# Patient Record
Sex: Male | Born: 1950 | Race: Black or African American | Hispanic: No | Marital: Married | Smoking: Never smoker
Health system: Southern US, Community
[De-identification: ages and names within clinical notes are randomized; demographics above are authoritative.]

## PROBLEM LIST (undated history)

## (undated) DIAGNOSIS — E785 Hyperlipidemia, unspecified: Secondary | ICD-10-CM

## (undated) DIAGNOSIS — N200 Calculus of kidney: Secondary | ICD-10-CM

## (undated) DIAGNOSIS — K635 Polyp of colon: Secondary | ICD-10-CM

## (undated) DIAGNOSIS — J45909 Unspecified asthma, uncomplicated: Secondary | ICD-10-CM

## (undated) HISTORY — PX: HERNIA REPAIR: SHX51

## (undated) HISTORY — DX: Unspecified asthma, uncomplicated: J45.909

## (undated) HISTORY — DX: Calculus of kidney: N20.0

## (undated) HISTORY — DX: Polyp of colon: K63.5

## (undated) HISTORY — PX: EYE SURGERY: SHX253

## (undated) HISTORY — DX: Hyperlipidemia, unspecified: E78.5

## (undated) HISTORY — PX: FOOT SURGERY: SHX648

## (undated) HISTORY — PX: KNEE SURGERY: SHX244

---

## 2009-07-30 ENCOUNTER — Ambulatory Visit: Payer: Self-pay | Admitting: Family Medicine

## 2010-05-09 ENCOUNTER — Ambulatory Visit: Payer: Self-pay | Admitting: Family Medicine

## 2010-11-21 LAB — HM COLONOSCOPY

## 2010-11-27 LAB — HM COLONOSCOPY

## 2015-01-03 ENCOUNTER — Ambulatory Visit: Payer: Self-pay | Admitting: Family Medicine

## 2015-05-31 ENCOUNTER — Other Ambulatory Visit: Payer: Self-pay | Admitting: Family Medicine

## 2015-06-04 ENCOUNTER — Telehealth: Payer: Self-pay | Admitting: Family Medicine

## 2015-06-04 ENCOUNTER — Other Ambulatory Visit: Payer: Self-pay | Admitting: Emergency Medicine

## 2015-06-04 MED ORDER — ATORVASTATIN CALCIUM 20 MG PO TABS: 20.0000 mg | ORAL_TABLET | Freq: Every day | ORAL | Status: DC

## 2015-06-04 NOTE — Telephone Encounter (Signed)
Script sent  

## 2015-06-04 NOTE — Telephone Encounter (Signed)
30 day supply sent to [address]

## 2015-06-04 NOTE — Telephone Encounter (Signed)
Patient scheduled appointment for 06-20-15 but is asking you to send his atorvastatin to [facility]. Also requesting a lab order that way his labs will be here for his appointment.

## 2015-06-05 ENCOUNTER — Other Ambulatory Visit: Payer: Self-pay | Admitting: Family Medicine

## 2015-06-06 NOTE — Telephone Encounter (Signed)
Called to pharmacy 

## 2015-06-11 ENCOUNTER — Telehealth: Payer: Self-pay | Admitting: Family Medicine

## 2015-06-11 DIAGNOSIS — Z0000 Encounter for general adult medical examination without abnormal findings: Secondary | ICD-10-CM

## 2015-06-11 NOTE — Telephone Encounter (Signed)
Patient has appointment for next week and is needing his lab order possibly today

## 2015-06-12 NOTE — Telephone Encounter (Signed)
CBC CMP TSH lipid panel and PSA

## 2015-06-13 NOTE — Telephone Encounter (Signed)
Labs have been ordered and slip placed in file cabinet for pt pick up

## 2015-06-13 NOTE — Telephone Encounter (Signed)
Patient informed. 

## 2015-06-14 DIAGNOSIS — Z0000 Encounter for general adult medical examination without abnormal findings: Secondary | ICD-10-CM | POA: Diagnosis not present

## 2015-06-15 LAB — COMPREHENSIVE METABOLIC PANEL
ALK PHOS: 60 IU/L (ref 39–117)
ALT: 16 IU/L (ref 0–44)
AST: 29 IU/L (ref 0–40)
Albumin/Globulin Ratio: 1.8 (ref 1.1–2.5)
Albumin: 4.3 g/dL (ref 3.6–4.8)
BILIRUBIN TOTAL: 0.3 mg/dL (ref 0.0–1.2)
BUN/Creatinine Ratio: 13 (ref 10–22)
BUN: 17 mg/dL (ref 8–27)
CHLORIDE: 103 mmol/L (ref 97–106)
CO2: 25 mmol/L (ref 18–29)
Calcium: 9.2 mg/dL (ref 8.6–10.2)
Creatinine, Ser: 1.27 mg/dL (ref 0.76–1.27)
GFR calc Af Amer: 68 mL/min/{1.73_m2} (ref >59)
GFR calc non Af Amer: 59 mL/min/{1.73_m2} — ABNORMAL LOW (ref >59)
GLUCOSE: 107 mg/dL — AB (ref 65–99)
Globulin, Total: 2.4 g/dL (ref 1.5–4.5)
Potassium: 4.2 mmol/L (ref 3.5–5.2)
Sodium: 142 mmol/L (ref 136–144)
Total Protein: 6.7 g/dL (ref 6.0–8.5)

## 2015-06-15 LAB — CBC
HEMATOCRIT: 37.7 % (ref 37.5–51.0)
HEMOGLOBIN: 12.1 g/dL — AB (ref 12.6–17.7)
MCH: 27.1 pg (ref 26.6–33.0)
MCHC: 32.1 g/dL (ref 31.5–35.7)
MCV: 84 fL (ref 79–97)
Platelets: 154 10*3/uL (ref 150–379)
RBC: 4.47 x10E6/uL (ref 4.14–5.80)
RDW: 13.4 % (ref 12.3–15.4)
WBC: 3.7 10*3/uL (ref 3.4–10.8)

## 2015-06-15 LAB — LIPID PANEL
CHOLESTEROL TOTAL: 151 mg/dL (ref 100–199)
Chol/HDL Ratio: 2.4 ratio units (ref 0.0–5.0)
HDL: 62 mg/dL (ref >39)
LDL CALC: 80 mg/dL (ref 0–99)
TRIGLYCERIDES: 44 mg/dL (ref 0–149)
VLDL CHOLESTEROL CAL: 9 mg/dL (ref 5–40)

## 2015-06-15 LAB — TSH: TSH: 1.92 u[IU]/mL (ref 0.450–4.500)

## 2015-06-15 LAB — PSA: Prostate Specific Ag, Serum: 1.7 ng/mL (ref 0.0–4.0)

## 2015-06-20 ENCOUNTER — Encounter: Payer: Self-pay | Admitting: Family Medicine

## 2015-06-20 ENCOUNTER — Ambulatory Visit (INDEPENDENT_AMBULATORY_CARE_PROVIDER_SITE_OTHER): Payer: Medicare Other | Admitting: Family Medicine

## 2015-06-20 VITALS — BP 110/76 | HR 70 | Temp 98.1°F | Resp 18 | Ht 72.0 in | Wt 199.6 lb

## 2015-06-20 DIAGNOSIS — R739 Hyperglycemia, unspecified: Secondary | ICD-10-CM | POA: Diagnosis not present

## 2015-06-20 DIAGNOSIS — E785 Hyperlipidemia, unspecified: Secondary | ICD-10-CM | POA: Insufficient documentation

## 2015-06-20 LAB — GLUCOSE, POCT (MANUAL RESULT ENTRY): POC Glucose: 83 mg/dl (ref 70–99)

## 2015-06-20 LAB — POCT GLYCOSYLATED HEMOGLOBIN (HGB A1C): Hemoglobin A1C: 5.7

## 2015-06-20 NOTE — Progress Notes (Signed)
Name: Jason Hayes   MRN: [id number]    DOB: 1951-03-06   Date:06/20/2015       Progress Note  Subjective  Chief Complaint  Chief Complaint  Patient presents with  . Hyperlipidemia    6 month follow up    HPI  Hyperlipidemia  Patient has a history of hyperlipidemia for over 5 years.  Current medical regimen consist of atorvastatin 20 mg daily at bedtime .  Compliance is good .  Diet and exercise are currently followed regularly .  Risk factors for cardiovascular disease include hyperlipidemia and age .   There have been no side effects from the medication.    Hyperglycemia  History of elevated glucose to 107 on 06/09/2017. His last A1c was August 2013. There is no history of any polyuria polydipsia polyphagia.3 Past Medical History  Diagnosis Date  . Hyperlipidemia     Social History  Substance Use Topics  . Smoking status: Never Smoker   . Smokeless tobacco: Never Used  . Alcohol Use: No     Current outpatient prescriptions:  .  atorvastatin (LIPITOR) 20 MG tablet, TAKE ONE TABLET BY MOUTH AT BEDTIME, Disp: 30 tablet, Rfl: 0  No Known Allergies  Review of Systems  Constitutional: Negative for fever, chills and weight loss.  HENT: Negative for congestion, hearing loss, sore throat and tinnitus.   Eyes: Negative for blurred vision, double vision and redness.  Respiratory: Negative for cough, hemoptysis and shortness of breath.   Cardiovascular: Negative for chest pain, palpitations, orthopnea, claudication and leg swelling.  Gastrointestinal: Negative for heartburn, nausea, vomiting, diarrhea, constipation and blood in stool.  Genitourinary: Negative for dysuria, urgency, frequency and hematuria.  Musculoskeletal: Negative for myalgias, back pain, joint pain, falls and neck pain.  Skin: Negative for itching.  Neurological: Negative for dizziness, tingling, tremors, focal weakness, seizures, loss of consciousness, weakness and headaches.  Endo/Heme/Allergies: Does  not bruise/bleed easily.  Psychiatric/Behavioral: Negative for depression and substance abuse. The patient is not nervous/anxious and does not have insomnia.      Objective  Filed Vitals:   06/20/15 0851  BP: 110/76  Pulse: 70  Temp: 98.1 F (36.7 C)  TempSrc: Oral  Resp: 18  Height: 6' (1.829 m)  Weight: 199 lb 9.6 oz (90.538 kg)  SpO2: 97%     Physical Exam  Constitutional: He is oriented to person, place, and time and well-developed, well-nourished, and in no distress.  HENT:  Head: Normocephalic.  Eyes: EOM are normal. Pupils are equal, round, and reactive to light.  Neck: Normal range of motion. Neck supple. No thyromegaly present.  Cardiovascular: Normal rate, regular rhythm and normal heart sounds.   No murmur heard. Pulmonary/Chest: Effort normal and breath sounds normal. No respiratory distress. He has no wheezes.  Abdominal: Soft. Bowel sounds are normal.  Musculoskeletal: Normal range of motion. He exhibits no edema.  Lymphadenopathy:    He has no cervical adenopathy.  Neurological: He is alert and oriented to person, place, and time. No cranial nerve deficit. Gait normal. Coordination normal.  Skin: Skin is warm and dry. No rash noted.  Psychiatric: Affect and judgment normal.      Assessment & Plan   1. Hyperlipidemia Lipid panel and CMP  2. Hyperglycemia  - POCT Glucose (CBG) - POCT HgB A1C

## 2015-07-09 ENCOUNTER — Other Ambulatory Visit: Payer: Self-pay

## 2015-07-09 MED ORDER — ATORVASTATIN CALCIUM 20 MG PO TABS: 20.0000 mg | ORAL_TABLET | Freq: Every day | ORAL | Status: DC

## 2015-08-29 ENCOUNTER — Other Ambulatory Visit: Payer: Self-pay | Admitting: Family Medicine

## 2015-08-29 MED ORDER — ATORVASTATIN CALCIUM 20 MG PO TABS: 20.0000 mg | ORAL_TABLET | Freq: Every day | ORAL | Status: DC

## 2015-09-03 ENCOUNTER — Other Ambulatory Visit: Payer: Self-pay

## 2015-09-03 MED ORDER — ATORVASTATIN CALCIUM 20 MG PO TABS: 20.0000 mg | ORAL_TABLET | Freq: Every day | ORAL | Status: DC

## 2015-12-14 DIAGNOSIS — H40003 Preglaucoma, unspecified, bilateral: Secondary | ICD-10-CM | POA: Diagnosis not present

## 2015-12-18 ENCOUNTER — Ambulatory Visit: Payer: Medicare Other | Admitting: Family Medicine

## 2016-01-18 ENCOUNTER — Telehealth: Payer: Self-pay | Admitting: *Deleted

## 2016-01-21 ENCOUNTER — Ambulatory Visit (INDEPENDENT_AMBULATORY_CARE_PROVIDER_SITE_OTHER): Payer: Medicare Other | Admitting: Family Medicine

## 2016-01-21 ENCOUNTER — Encounter: Payer: Self-pay | Admitting: Family Medicine

## 2016-01-21 VITALS — BP 118/62 | HR 65 | Temp 98.1°F | Ht 71.25 in | Wt 202.2 lb

## 2016-01-21 DIAGNOSIS — Z0000 Encounter for general adult medical examination without abnormal findings: Secondary | ICD-10-CM

## 2016-01-21 MED ORDER — PNEUMOCOCCAL VAC POLYVALENT 25 MCG/0.5ML IJ INJ: 0.5000 mL | INJECTION | INTRAMUSCULAR | Status: DC

## 2016-01-21 NOTE — Patient Instructions (Signed)
You can start daily aspirin 81 mg if you like.  Continue the lipitor.  Follow up in 6 months.  Take care  Dr. Lacinda Axon   Health Maintenance, Male A healthy lifestyle and preventative care can promote health and wellness.  Maintain regular health, dental, and eye exams.  Eat a healthy diet. Foods like vegetables, fruits, whole grains, low-fat dairy products, and lean protein foods contain the nutrients you need and are low in calories. Decrease your intake of foods high in solid fats, added sugars, and salt. Get information about a proper diet from your health care provider, if necessary.  Regular physical exercise is one of the most important things you can do for your health. Most adults should get at least 150 minutes of moderate-intensity exercise (any activity that increases your heart rate and causes you to sweat) each week. In addition, most adults need muscle-strengthening exercises on 2 or more days a week.   Maintain a healthy weight. The body mass index (BMI) is a screening tool to identify possible weight problems. It provides an estimate of body fat based on height and weight. Your health care provider can find your BMI and can help you achieve or maintain a healthy weight. For males 20 years and older:  A BMI below 18.5 is considered underweight.  A BMI of 18.5 to 24.9 is normal.  A BMI of 25 to 29.9 is considered overweight.  A BMI of 30 and above is considered obese.  Maintain normal blood lipids and cholesterol by exercising and minimizing your intake of saturated fat. Eat a balanced diet with plenty of fruits and vegetables. Blood tests for lipids and cholesterol should begin at age 4 and be repeated every 5 years. If your lipid or cholesterol levels are high, you are over age 39, or you are at high risk for heart disease, you may need your cholesterol levels checked more frequently.Ongoing high lipid and cholesterol levels should be treated with medicines if diet and  exercise are not working.  If you smoke, find out from your health care provider how to quit. If you do not use tobacco, do not start.  Lung cancer screening is recommended for adults aged 45-80 years who are at high risk for developing lung cancer because of a history of smoking. A yearly low-dose CT scan of the lungs is recommended for people who have at least a 30-pack-year history of smoking and are current smokers or have quit within the past 15 years. A pack year of smoking is smoking an average of 1 pack of cigarettes a day for 1 year (for example, a 30-pack-year history of smoking could mean smoking 1 pack a day for 30 years or 2 packs a day for 15 years). Yearly screening should continue until the smoker has stopped smoking for at least 15 years. Yearly screening should be stopped for people who develop a health problem that would prevent them from having lung cancer treatment.  If you choose to drink alcohol, do not have more than 2 drinks per day. One drink is considered to be 12 oz (360 mL) of beer, 5 oz (150 mL) of wine, or 1.5 oz (45 mL) of liquor.  Avoid the use of street drugs. Do not share needles with anyone. Ask for help if you need support or instructions about stopping the use of drugs.  High blood pressure causes heart disease and increases the risk of stroke. High blood pressure is more likely to develop in:  People  who have blood pressure in the end of the normal range (100-139/85-89 mm Hg).  People who are overweight or obese.  People who are African American.  If you are 23-27 years of age, have your blood pressure checked every 3-5 years. If you are 82 years of age or older, have your blood pressure checked every year. You should have your blood pressure measured twice--once when you are at a hospital or clinic, and once when you are not at a hospital or clinic. Record the average of the two measurements. To check your blood pressure when you are not at a hospital or  clinic, you can use:  An automated blood pressure machine at a pharmacy.  A home blood pressure monitor.  If you are 70-45 years old, ask your health care provider if you should take aspirin to prevent heart disease.  Diabetes screening involves taking a blood sample to check your fasting blood sugar level. This should be done once every 3 years after age 84 if you are at a normal weight and without risk factors for diabetes. Testing should be considered at a younger age or be carried out more frequently if you are overweight and have at least 1 risk factor for diabetes.  Colorectal cancer can be detected and often prevented. Most routine colorectal cancer screening begins at the age of 71 and continues through age 11. However, your health care provider may recommend screening at an earlier age if you have risk factors for colon cancer. On a yearly basis, your health care provider may provide home test kits to check for hidden blood in the stool. A small camera at the end of a tube may be used to directly examine the colon (sigmoidoscopy or colonoscopy) to detect the earliest forms of colorectal cancer. Talk to your health care provider about this at age 24 when routine screening begins. A direct exam of the colon should be repeated every 5-10 years through age 56, unless early forms of precancerous polyps or small growths are found.  People who are at an increased risk for hepatitis B should be screened for this virus. You are considered at high risk for hepatitis B if:  You were born in a country where hepatitis B occurs often. Talk with your health care provider about which countries are considered high risk.  Your parents were born in a high-risk country and you have not received a shot to protect against hepatitis B (hepatitis B vaccine).  You have HIV or AIDS.  You use needles to inject street drugs.  You live with, or have sex with, someone who has hepatitis B.  You are a man who has  sex with other men (MSM).  You get hemodialysis treatment.  You take certain medicines for conditions like cancer, organ transplantation, and autoimmune conditions.  Hepatitis C blood testing is recommended for all people born from [birth year] through 1966 and any individual with known risk factors for hepatitis C.  Healthy men should no longer receive prostate-specific antigen (PSA) blood tests as part of routine cancer screening. Talk to your health care provider about prostate cancer screening.  Testicular cancer screening is not recommended for adolescents or adult males who have no symptoms. Screening includes self-exam, a health care provider exam, and other screening tests. Consult with your health care provider about any symptoms you have or any concerns you have about testicular cancer.  Practice safe sex. Use condoms and avoid high-risk sexual practices to reduce the spread of sexually  transmitted infections (STIs).  You should be screened for STIs, including gonorrhea and chlamydia if:  You are sexually active and are younger than 24 years.  You are older than 24 years, and your health care provider tells you that you are at risk for this type of infection.  Your sexual activity has changed since you were last screened, and you are at an increased risk for chlamydia or gonorrhea. Ask your health care provider if you are at risk.  If you are at risk of being infected with HIV, it is recommended that you take a prescription medicine daily to prevent HIV infection. This is called pre-exposure prophylaxis (PrEP). You are considered at risk if:  You are a man who has sex with other men (MSM).  You are a heterosexual man who is sexually active with multiple partners.  You take drugs by injection.  You are sexually active with a partner who has HIV.  Talk with your health care provider about whether you are at high risk of being infected with HIV. If you choose to begin PrEP, you should  first be tested for HIV. You should then be tested every 3 months for as long as you are taking PrEP.  Use sunscreen. Apply sunscreen liberally and repeatedly throughout the day. You should seek shade when your shadow is shorter than you. Protect yourself by wearing long sleeves, pants, a wide-brimmed hat, and sunglasses year round whenever you are outdoors.  Tell your health care provider of new moles or changes in moles, especially if there is a change in shape or color. Also, tell your health care provider if a mole is larger than the size of a pencil eraser.  A one-time screening for abdominal aortic aneurysm (AAA) and surgical repair of large AAAs by ultrasound is recommended for men aged 92-75 years who are current or former smokers.  Stay current with your vaccines (immunizations).   This information is not intended to replace advice given to you by your health care provider. Make sure you discuss any questions you have with your health care provider.   Document Released: 01/17/2008 Document Revised: 08/11/2014 Document Reviewed: 12/16/2010 Elsevier Interactive Patient Education [employer].

## 2016-01-21 NOTE — Progress Notes (Signed)
Pre visit review using our clinic review tool, if applicable. No additional management support is needed unless otherwise documented below in the visit note. 

## 2016-01-22 DIAGNOSIS — Z0001 Encounter for general adult medical examination with abnormal findings: Secondary | ICD-10-CM | POA: Insufficient documentation

## 2016-01-22 NOTE — Progress Notes (Signed)
Subjective:  Patient ID: Jason Hayes, male    DOB: [birth year]  Age: 65 y.o. MRN: [id number]  CC: Establish care  HPI Jason Hayes is a 65 y.o. male presents to the clinic today to establish care.  Preventative Healthcare  Colonoscopy: Up to date.    Immunizations  Tetanus - Unsure. Would like me to obtain records from prior PCP.  Pneumococcal - In need of.  Zoster - Up to date.   Prostate cancer screening: Has had PSA in 2016.  Hepatitis C screening - Declines.  Labs: Will wait on labs.  Exercise: Regular exercise. Runs 10K 3X/week.  Alcohol use: None.  Smoking/tobacco use: Nonsmoker  STD/HIV testing: Declines.  Regular dental exams: Yes.  Wears seat belt: Yes.  PMH, Surgical Hx, Family Hx, Social History reviewed and updated as below.  Past Medical History  Diagnosis Date  . Hyperlipidemia   . Asthma   . Kidney stones   . Colon polyps    Past Surgical History  Procedure Laterality Date  . Knee surgery    . Foot surgery    . Hernia repair    . Eye surgery     Family History  Problem Relation Age of Onset  . Hyperlipidemia Mother   . Hypertension Mother   . Diabetes Mother   . Heart disease Mother   . Hypertension Father   . Hyperlipidemia Father   . Diabetes Father   . Heart disease Father   . Hyperlipidemia Sister   . Hypertension Sister   . Diabetes Sister   . Cancer Sister   . Ovarian cancer Sister   . Hypertension Brother   . Hyperlipidemia Brother   . Diabetes Brother   . Prostate cancer Brother   . Prostate cancer Brother   . Stroke Sister    Social History  Substance Use Topics  . Smoking status: Never Smoker   . Smokeless tobacco: Never Used  . Alcohol Use: No   Review of Systems  Gastrointestinal:       Recent nausea, diarrhea (now resolved).  Genitourinary:       Frequent urination.  All other systems reviewed and are negative.  Objective:   Today's Vitals: BP 118/62 mmHg  Pulse 65  Temp(Src) 98.1 F (36.7  C) (Oral)  Ht 5' 11.25" (1.81 m)  Wt 202 lb 4 oz (91.74 kg)  BMI 28.00 kg/m2  SpO2 98%  Physical Exam  Constitutional: He is oriented to person, place, and time. He appears well-developed and well-nourished. No distress.  HENT:  Head: Normocephalic and atraumatic.  Nose: Nose normal.  Mouth/Throat: Oropharynx is clear and moist. No oropharyngeal exudate.  Normal TM's bilaterally.   Eyes: Conjunctivae are normal. No scleral icterus.  Neck: Neck supple. No thyromegaly present.  Cardiovascular: Normal rate and regular rhythm.   No murmur heard. Pulmonary/Chest: Effort normal and breath sounds normal. He has no wheezes. He has no rales.  Abdominal: Soft. He exhibits no distension. There is no tenderness. There is no rebound and no guarding.  Musculoskeletal: Normal range of motion. He exhibits no edema.  Lymphadenopathy:    He has no cervical adenopathy.  Neurological: He is alert and oriented to person, place, and time.  Skin: Skin is warm and dry. No rash noted.  Psychiatric: He has a normal mood and affect.  Vitals reviewed.  Assessment & Plan:   Problem List Items Addressed This Visit    Preventative health care - Primary    Rx for pneumovax given.  Colonoscopy up-to-date. Zostavax up-to-date. Declined hepatitis and HIV screening. Obtain records regarding tetanus.          Outpatient Encounter Prescriptions as of 01/21/2016  Medication Sig  . atorvastatin (LIPITOR) 20 MG tablet Take 1 tablet (20 mg total) by mouth at bedtime.  . pneumococcal 23 valent vaccine (PNEUMOVAX 23) 25 MCG/0.5ML injection Inject 0.5 mLs into the muscle tomorrow at 10 am.  . [DISCONTINUED] atorvastatin (LIPITOR) 20 MG tablet Take 1 tablet (20 mg total) by mouth at bedtime.   No facility-administered encounter medications on file as of 01/21/2016.    Follow-up: 6 months.  [place]

## 2016-01-22 NOTE — Assessment & Plan Note (Signed)
Rx for pneumovax given.  Colonoscopy up-to-date. Zostavax up-to-date. Declined hepatitis and HIV screening. Obtain records regarding tetanus.

## 2016-04-30 ENCOUNTER — Telehealth: Payer: Self-pay | Admitting: *Deleted

## 2016-04-30 MED ORDER — ATORVASTATIN CALCIUM 20 MG PO TABS: 20.0000 mg | ORAL_TABLET | Freq: Every day | ORAL | 0 refills | Status: DC

## 2016-04-30 NOTE — Telephone Encounter (Signed)
Patient has requested a medication refill for atorvastatin  Pharmacy [facility]

## 2016-04-30 NOTE — Telephone Encounter (Signed)
Refilled 09/03/15 but refilled by Dr. Rutherford Nail. Last seen by Dr.Cook on 01/21/16. pts last labs on 06/14/15. Please advise?

## 2016-04-30 NOTE — Telephone Encounter (Signed)
Refills sent to the pharmacy. Patient will need to discuss obtaining labs with Dr. Lacinda Axon.

## 2016-05-01 NOTE — Telephone Encounter (Signed)
Pt will have labs at next OV in december

## 2016-07-22 ENCOUNTER — Other Ambulatory Visit: Payer: Self-pay | Admitting: Family Medicine

## 2016-07-22 NOTE — Telephone Encounter (Signed)
Dr.Cook patient  

## 2016-07-23 ENCOUNTER — Ambulatory Visit (INDEPENDENT_AMBULATORY_CARE_PROVIDER_SITE_OTHER): Payer: Medicare Other | Admitting: Family Medicine

## 2016-07-23 ENCOUNTER — Encounter: Payer: Self-pay | Admitting: Family Medicine

## 2016-07-23 VITALS — BP 116/64 | HR 57 | Temp 97.8°F | Resp 12 | Ht 71.0 in | Wt 209.0 lb

## 2016-07-23 DIAGNOSIS — Z125 Encounter for screening for malignant neoplasm of prostate: Secondary | ICD-10-CM

## 2016-07-23 DIAGNOSIS — R399 Unspecified symptoms and signs involving the genitourinary system: Secondary | ICD-10-CM

## 2016-07-23 DIAGNOSIS — Z23 Encounter for immunization: Secondary | ICD-10-CM

## 2016-07-23 DIAGNOSIS — Z0001 Encounter for general adult medical examination with abnormal findings: Secondary | ICD-10-CM

## 2016-07-23 DIAGNOSIS — R739 Hyperglycemia, unspecified: Secondary | ICD-10-CM | POA: Diagnosis not present

## 2016-07-23 DIAGNOSIS — Z862 Personal history of diseases of the blood and blood-forming organs and certain disorders involving the immune mechanism: Secondary | ICD-10-CM | POA: Diagnosis not present

## 2016-07-23 DIAGNOSIS — E785 Hyperlipidemia, unspecified: Secondary | ICD-10-CM | POA: Diagnosis not present

## 2016-07-23 MED ORDER — ATORVASTATIN CALCIUM 20 MG PO TABS: 20.0000 mg | ORAL_TABLET | Freq: Every day | ORAL | 0 refills | Status: DC

## 2016-07-23 NOTE — Progress Notes (Signed)
Pre visit review using our clinic review tool, if applicable. No additional management support is needed unless otherwise documented below in the visit note. 

## 2016-07-23 NOTE — Patient Instructions (Signed)
Continue the lipitor.  Follow up annually.  Take care  Dr. Lacinda Axon   Health Maintenance, Male A healthy lifestyle and preventative care can promote health and wellness.  Maintain regular health, dental, and eye exams.  Eat a healthy diet. Foods like vegetables, fruits, whole grains, low-fat dairy products, and lean protein foods contain the nutrients you need and are low in calories. Decrease your intake of foods high in solid fats, added sugars, and salt. Get information about a proper diet from your health care provider, if necessary.  Regular physical exercise is one of the most important things you can do for your health. Most adults should get at least 150 minutes of moderate-intensity exercise (any activity that increases your heart rate and causes you to sweat) each week. In addition, most adults need muscle-strengthening exercises on 2 or more days a week.   Maintain a healthy weight. The body mass index (BMI) is a screening tool to identify possible weight problems. It provides an estimate of body fat based on height and weight. Your health care provider can find your BMI and can help you achieve or maintain a healthy weight. For males 20 years and older:  A BMI below 18.5 is considered underweight.  A BMI of 18.5 to 24.9 is normal.  A BMI of 25 to 29.9 is considered overweight.  A BMI of 30 and above is considered obese.  Maintain normal blood lipids and cholesterol by exercising and minimizing your intake of saturated fat. Eat a balanced diet with plenty of fruits and vegetables. Blood tests for lipids and cholesterol should begin at age 48 and be repeated every 5 years. If your lipid or cholesterol levels are high, you are over age 29, or you are at high risk for heart disease, you may need your cholesterol levels checked more frequently.Ongoing high lipid and cholesterol levels should be treated with medicines if diet and exercise are not working.  If you smoke, find out from  your health care provider how to quit. If you do not use tobacco, do not start.  Lung cancer screening is recommended for adults aged 80-80 years who are at high risk for developing lung cancer because of a history of smoking. A yearly low-dose CT scan of the lungs is recommended for people who have at least a 30-pack-year history of smoking and are current smokers or have quit within the past 15 years. A pack year of smoking is smoking an average of 1 pack of cigarettes a day for 1 year (for example, a 30-pack-year history of smoking could mean smoking 1 pack a day for 30 years or 2 packs a day for 15 years). Yearly screening should continue until the smoker has stopped smoking for at least 15 years. Yearly screening should be stopped for people who develop a health problem that would prevent them from having lung cancer treatment.  If you choose to drink alcohol, do not have more than 2 drinks per day. One drink is considered to be 12 oz (360 mL) of beer, 5 oz (150 mL) of wine, or 1.5 oz (45 mL) of liquor.  Avoid the use of street drugs. Do not share needles with anyone. Ask for help if you need support or instructions about stopping the use of drugs.  High blood pressure causes heart disease and increases the risk of stroke. High blood pressure is more likely to develop in:  People who have blood pressure in the end of the normal range (100-139/85-89 mm  Hg).  People who are overweight or obese.  People who are African American.  If you are 50-17 years of age, have your blood pressure checked every 3-5 years. If you are 35 years of age or older, have your blood pressure checked every year. You should have your blood pressure measured twice-once when you are at a hospital or clinic, and once when you are not at a hospital or clinic. Record the average of the two measurements. To check your blood pressure when you are not at a hospital or clinic, you can use:  An automated blood pressure machine at  a pharmacy.  A home blood pressure monitor.  If you are 51-30 years old, ask your health care provider if you should take aspirin to prevent heart disease.  Diabetes screening involves taking a blood sample to check your fasting blood sugar level. This should be done once every 3 years after age 74 if you are at a normal weight and without risk factors for diabetes. Testing should be considered at a younger age or be carried out more frequently if you are overweight and have at least 1 risk factor for diabetes.  Colorectal cancer can be detected and often prevented. Most routine colorectal cancer screening begins at the age of 39 and continues through age 5. However, your health care provider may recommend screening at an earlier age if you have risk factors for colon cancer. On a yearly basis, your health care provider may provide home test kits to check for hidden blood in the stool. A small camera at the end of a tube may be used to directly examine the colon (sigmoidoscopy or colonoscopy) to detect the earliest forms of colorectal cancer. Talk to your health care provider about this at age 12 when routine screening begins. A direct exam of the colon should be repeated every 5-10 years through age 9, unless early forms of precancerous polyps or small growths are found.  People who are at an increased risk for hepatitis B should be screened for this virus. You are considered at high risk for hepatitis B if:  You were born in a country where hepatitis B occurs often. Talk with your health care provider about which countries are considered high risk.  Your parents were born in a high-risk country and you have not received a shot to protect against hepatitis B (hepatitis B vaccine).  You have HIV or AIDS.  You use needles to inject street drugs.  You live with, or have sex with, someone who has hepatitis B.  You are a man who has sex with other men (MSM).  You get hemodialysis  treatment.  You take certain medicines for conditions like cancer, organ transplantation, and autoimmune conditions.  Hepatitis C blood testing is recommended for all people born from [birth year] through 1966 and any individual with known risk factors for hepatitis C.  Healthy men should no longer receive prostate-specific antigen (PSA) blood tests as part of routine cancer screening. Talk to your health care provider about prostate cancer screening.  Testicular cancer screening is not recommended for adolescents or adult males who have no symptoms. Screening includes self-exam, a health care provider exam, and other screening tests. Consult with your health care provider about any symptoms you have or any concerns you have about testicular cancer.  Practice safe sex. Use condoms and avoid high-risk sexual practices to reduce the spread of sexually transmitted infections (STIs).  You should be screened for STIs, including gonorrhea and  chlamydia if:  You are sexually active and are younger than 24 years.  You are older than 24 years, and your health care provider tells you that you are at risk for this type of infection.  Your sexual activity has changed since you were last screened, and you are at an increased risk for chlamydia or gonorrhea. Ask your health care provider if you are at risk.  If you are at risk of being infected with HIV, it is recommended that you take a prescription medicine daily to prevent HIV infection. This is called pre-exposure prophylaxis (PrEP). You are considered at risk if:  You are a man who has sex with other men (MSM).  You are a heterosexual man who is sexually active with multiple partners.  You take drugs by injection.  You are sexually active with a partner who has HIV.  Talk with your health care provider about whether you are at high risk of being infected with HIV. If you choose to begin PrEP, you should first be tested for HIV. You should then be tested  every 3 months for as long as you are taking PrEP.  Use sunscreen. Apply sunscreen liberally and repeatedly throughout the day. You should seek shade when your shadow is shorter than you. Protect yourself by wearing long sleeves, pants, a wide-brimmed hat, and sunglasses year round whenever you are outdoors.  Tell your health care provider of new moles or changes in moles, especially if there is a change in shape or color. Also, tell your health care provider if a mole is larger than the size of a pencil eraser.  A one-time screening for abdominal aortic aneurysm (AAA) and surgical repair of large AAAs by ultrasound is recommended for men aged 26-75 years who are current or former smokers.  Stay current with your vaccines (immunizations). This information is not intended to replace advice given to you by your health care provider. Make sure you discuss any questions you have with your health care provider. Document Released: 01/17/2008 Document Revised: 08/11/2014 Document Reviewed: 04/24/2015 Elsevier Interactive Patient Education  2017 [employer].

## 2016-07-24 LAB — LIPID PANEL
Cholesterol: 155 mg/dL (ref 0–200)
HDL: 53.4 mg/dL (ref >39.00)
LDL CALC: 91 mg/dL (ref 0–99)
NONHDL: 101.28
Total CHOL/HDL Ratio: 3
Triglycerides: 49 mg/dL (ref 0.0–149.0)
VLDL: 9.8 mg/dL (ref 0.0–40.0)

## 2016-07-24 LAB — COMPREHENSIVE METABOLIC PANEL
ALK PHOS: 61 U/L (ref 39–117)
ALT: 17 U/L (ref 0–53)
AST: 23 U/L (ref 0–37)
Albumin: 4.4 g/dL (ref 3.5–5.2)
BUN: 18 mg/dL (ref 6–23)
CHLORIDE: 106 meq/L (ref 96–112)
CO2: 31 mEq/L (ref 19–32)
Calcium: 9.8 mg/dL (ref 8.4–10.5)
Creatinine, Ser: 1.4 mg/dL (ref 0.40–1.50)
GFR: 65.17 mL/min (ref >60.00)
GLUCOSE: 92 mg/dL (ref 70–99)
POTASSIUM: 4.2 meq/L (ref 3.5–5.1)
SODIUM: 142 meq/L (ref 135–145)
TOTAL PROTEIN: 7.3 g/dL (ref 6.0–8.3)
Total Bilirubin: 0.5 mg/dL (ref 0.2–1.2)

## 2016-07-24 LAB — CBC
HCT: 40.5 % (ref 39.0–52.0)
Hemoglobin: 13.1 g/dL (ref 13.0–17.0)
MCHC: 32.3 g/dL (ref 30.0–36.0)
MCV: 84.1 fl (ref 78.0–100.0)
Platelets: 158 K/uL (ref 150.0–400.0)
RBC: 4.81 Mil/uL (ref 4.22–5.81)
RDW: 13.4 % (ref 11.5–15.5)
WBC: 5.2 K/uL (ref 4.0–10.5)

## 2016-07-24 LAB — PSA: PSA: 2.37 ng/mL (ref 0.10–4.00)

## 2016-07-24 LAB — HEMOGLOBIN A1C: Hgb A1c MFr Bld: 5.8 % (ref 4.6–6.5)

## 2016-07-24 NOTE — Assessment & Plan Note (Signed)
Prevnar today. Labs today. See orders. Declines flu. PSA screening today. Placing referral for colonoscopy. Patient having LUTS. Patient states that this does not impact him enough to want to consider medication. This is likely from BPH. We'll continue to monitor closely.

## 2016-07-24 NOTE — Progress Notes (Signed)
Subjective:  Patient ID: Jason Hayes, male    DOB: 06/30/1951  Age: 66 y.o. MRN: [id number]  CC: Annual exam  HPI Jason Hayes is a 65 y.o. male presents to the clinic today for an annual exam.   Preventative Healthcare  Colonoscopy: Reports that he is due for (has been 5 years). Wants referral.  Immunizations  Tetanus - Unsure. No records from prior PCP.  Pneumococcal - In need of. Okay with receiving today.  Flu - Declines.   Zoster - Addressed.  Prostate cancer screening: With labs today.  Hepatitis C screening - Declined.  Labs: Labs today.  Exercise: Exercises regularly (runs).  Alcohol use: No.  Smoking/tobacco use: No.  PMH, Surgical Hx, Family Hx, Social History reviewed and updated as below.  Past Medical History:  Diagnosis Date  . Asthma   . Colon polyps   . Hyperlipidemia   . Kidney stones    Past Surgical History:  Procedure Laterality Date  . EYE SURGERY    . FOOT SURGERY    . HERNIA REPAIR    . KNEE SURGERY     Family History  Problem Relation Age of Onset  . Hyperlipidemia Mother   . Hypertension Mother   . Diabetes Mother   . Heart disease Mother   . Hypertension Father   . Hyperlipidemia Father   . Diabetes Father   . Heart disease Father   . Hyperlipidemia Sister   . Hypertension Sister   . Diabetes Sister   . Cancer Sister   . Ovarian cancer Sister   . Hypertension Brother   . Hyperlipidemia Brother   . Diabetes Brother   . Prostate cancer Brother   . Prostate cancer Brother   . Stroke Sister    Social History  Substance Use Topics  . Smoking status: Never Smoker  . Smokeless tobacco: Never Used  . Alcohol use No   Review of Systems  Genitourinary: Positive for frequency.  All other systems reviewed and are negative.  Objective:   Today's Vitals: BP 116/64   Pulse (!) 57   Temp 97.8 F (36.6 C) (Oral)   Resp 12   Ht 5\' 11"  ([id number] m) Comment: with shoes  Wt 209 lb (94.8 kg)   SpO2 98%   BMI 29.15  kg/m   Physical Exam  Constitutional: He is oriented to person, place, and time. He appears well-developed. No distress.  HENT:  Head: Normocephalic and atraumatic.  Eyes: Conjunctivae are normal.  Neck: Normal range of motion. Neck supple.  Cardiovascular: Normal rate and regular rhythm.   Pulmonary/Chest: Effort normal and breath sounds normal.  Abdominal: Soft. He exhibits no distension. There is no tenderness. There is no rebound and no guarding.  Musculoskeletal: Normal range of motion.  Neurological: He is alert and oriented to person, place, and time.  Skin: No rash noted.  Psychiatric: He has a normal mood and affect.  Vitals reviewed.  Assessment & Plan:   Problem List Items Addressed This Visit    Hyperlipidemia   Relevant Medications   atorvastatin (LIPITOR) 20 MG tablet   Other Relevant Orders   Lipid panel   Encounter for health maintenance examination with abnormal findings - Primary    Prevnar today. Labs today. See orders. Declines flu. PSA screening today. Placing referral for colonoscopy. Patient having LUTS. Patient states that this does not impact him enough to want to consider medication. This is likely from BPH. We'll continue to monitor closely.  Other Visit Diagnoses    Lower urinary tract symptoms (LUTS)       Relevant Orders   PSA   History of anemia       Relevant Orders   CBC   Blood glucose elevated       Relevant Orders   Hemoglobin A1c   Comprehensive metabolic panel   Screening for prostate cancer       Relevant Orders   PSA   Need for prophylactic vaccination against Streptococcus pneumoniae (pneumococcus)       Relevant Orders   Pneumococcal conjugate vaccine 13-valent (Completed)      Outpatient Encounter Prescriptions as of 07/23/2016  Medication Sig  . atorvastatin (LIPITOR) 20 MG tablet Take 1 tablet (20 mg total) by mouth at bedtime.  . [DISCONTINUED] atorvastatin (LIPITOR) 20 MG tablet Take 1 tablet (20 mg total)  by mouth at bedtime.  . [DISCONTINUED] pneumococcal 23 valent vaccine (PNEUMOVAX 23) 25 MCG/0.5ML injection Inject 0.5 mLs into the muscle tomorrow at 10 am. (Patient not taking: Reported on 07/23/2016)   No facility-administered encounter medications on file as of 07/23/2016.     Follow-up: Annually  [place]

## 2016-10-25 ENCOUNTER — Other Ambulatory Visit: Payer: Self-pay | Admitting: Family Medicine

## 2016-12-18 ENCOUNTER — Encounter: Payer: Self-pay | Admitting: Family Medicine

## 2016-12-18 ENCOUNTER — Ambulatory Visit (INDEPENDENT_AMBULATORY_CARE_PROVIDER_SITE_OTHER): Payer: Medicare Other | Admitting: Family Medicine

## 2016-12-18 ENCOUNTER — Ambulatory Visit (INDEPENDENT_AMBULATORY_CARE_PROVIDER_SITE_OTHER): Payer: Medicare Other

## 2016-12-18 DIAGNOSIS — Z0100 Encounter for examination of eyes and vision without abnormal findings: Secondary | ICD-10-CM | POA: Diagnosis not present

## 2016-12-18 DIAGNOSIS — R7303 Prediabetes: Secondary | ICD-10-CM | POA: Insufficient documentation

## 2016-12-18 NOTE — Progress Notes (Signed)
Pt had paperwork to have his vision checked. PCP gave verbal okay to check pts vision.

## 2016-12-19 NOTE — Progress Notes (Signed)
Changed to RN visit.   [place]

## 2016-12-26 NOTE — Progress Notes (Signed)
Care was provided under my supervision. 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  Thersa Salt DO

## 2017-01-27 ENCOUNTER — Other Ambulatory Visit: Payer: Self-pay | Admitting: Family Medicine

## 2017-02-19 ENCOUNTER — Telehealth: Payer: Self-pay | Admitting: Family Medicine

## 2017-02-19 NOTE — Telephone Encounter (Signed)
Spoke to pt's wife. Left message for Mr. Sainato to call office [phone]) to schedule awv. SF

## 2017-03-12 ENCOUNTER — Telehealth: Payer: Self-pay | Admitting: Family Medicine

## 2017-03-12 NOTE — Telephone Encounter (Signed)
Left pt message asking to call Ebony Hail back directly at [phone] to schedule AWV. Thanks!  *NOTE* Last AWV 01/21/16

## 2017-04-14 ENCOUNTER — Telehealth: Payer: Self-pay | Admitting: *Deleted

## 2017-04-14 NOTE — Telephone Encounter (Signed)
FYI : pt requested to have his PCP changed to Dr. Caryl Bis  Former [place] [facility]

## 2017-04-15 NOTE — Telephone Encounter (Signed)
Spoke to house hold member. Pt was not currently home and she will have him call back to schedule AWV.  Note* Last AWV 01/21/16

## 2017-04-16 ENCOUNTER — Other Ambulatory Visit: Payer: Self-pay | Admitting: Family Medicine

## 2017-07-14 ENCOUNTER — Other Ambulatory Visit: Payer: Self-pay | Admitting: Family Medicine

## 2017-08-05 ENCOUNTER — Ambulatory Visit (INDEPENDENT_AMBULATORY_CARE_PROVIDER_SITE_OTHER): Payer: Medicare Other | Admitting: Family Medicine

## 2017-08-05 ENCOUNTER — Encounter: Payer: Self-pay | Admitting: Family Medicine

## 2017-08-05 VITALS — BP 112/68 | HR 64 | Temp 97.7°F | Ht 71.0 in | Wt 198.0 lb

## 2017-08-05 DIAGNOSIS — Z125 Encounter for screening for malignant neoplasm of prostate: Secondary | ICD-10-CM

## 2017-08-05 DIAGNOSIS — B9789 Other viral agents as the cause of diseases classified elsewhere: Secondary | ICD-10-CM

## 2017-08-05 DIAGNOSIS — J988 Other specified respiratory disorders: Secondary | ICD-10-CM

## 2017-08-05 DIAGNOSIS — E785 Hyperlipidemia, unspecified: Secondary | ICD-10-CM

## 2017-08-05 DIAGNOSIS — R351 Nocturia: Secondary | ICD-10-CM | POA: Diagnosis not present

## 2017-08-05 DIAGNOSIS — R7303 Prediabetes: Secondary | ICD-10-CM | POA: Diagnosis not present

## 2017-08-05 DIAGNOSIS — N400 Enlarged prostate without lower urinary tract symptoms: Secondary | ICD-10-CM | POA: Insufficient documentation

## 2017-08-05 DIAGNOSIS — N401 Enlarged prostate with lower urinary tract symptoms: Secondary | ICD-10-CM

## 2017-08-05 DIAGNOSIS — Z1159 Encounter for screening for other viral diseases: Secondary | ICD-10-CM | POA: Diagnosis not present

## 2017-08-05 DIAGNOSIS — R7309 Other abnormal glucose: Secondary | ICD-10-CM | POA: Diagnosis not present

## 2017-08-05 LAB — COMPREHENSIVE METABOLIC PANEL
ALT: 25 U/L (ref 0–53)
AST: 32 U/L (ref 0–37)
Albumin: 4.6 g/dL (ref 3.5–5.2)
Alkaline Phosphatase: 70 U/L (ref 39–117)
BUN: 19 mg/dL (ref 6–23)
CHLORIDE: 103 meq/L (ref 96–112)
CO2: 31 mEq/L (ref 19–32)
Calcium: 9.8 mg/dL (ref 8.4–10.5)
Creatinine, Ser: 1.29 mg/dL (ref 0.40–1.50)
GFR: 71.4 mL/min (ref >60.00)
GLUCOSE: 92 mg/dL (ref 70–99)
POTASSIUM: 4.1 meq/L (ref 3.5–5.1)
SODIUM: 141 meq/L (ref 135–145)
Total Bilirubin: 0.7 mg/dL (ref 0.2–1.2)
Total Protein: 7.6 g/dL (ref 6.0–8.3)

## 2017-08-05 LAB — HEMOGLOBIN A1C: Hgb A1c MFr Bld: 6 % (ref 4.6–6.5)

## 2017-08-05 LAB — LIPID PANEL
Cholesterol: 168 mg/dL (ref 0–200)
HDL: 52.5 mg/dL (ref >39.00)
LDL CALC: 103 mg/dL — AB (ref 0–99)
NONHDL: 115.47
Total CHOL/HDL Ratio: 3
Triglycerides: 64 mg/dL (ref 0.0–149.0)
VLDL: 12.8 mg/dL (ref 0.0–40.0)

## 2017-08-05 LAB — PSA, MEDICARE: PSA: 3.06 ng/mL (ref 0.10–4.00)

## 2017-08-05 MED ORDER — PNEUMOCOCCAL VAC POLYVALENT 25 MCG/0.5ML IJ INJ: 0.5000 mL | INJECTION | Freq: Once | INTRAMUSCULAR | 0 refills | Status: AC

## 2017-08-05 NOTE — Assessment & Plan Note (Signed)
Suspect viral respiratory illness with possible bronchitis given patient's cough and respiratory symptoms.  He notes no cardiac symptoms.  Discussed this should improve on its own.  He will monitor and if not improving he will let us know.

## 2017-08-05 NOTE — Patient Instructions (Signed)
Nice to see you. We will contact you with your lab results. Please monitor your cough.  If this does not improve over the next week please let us know. Please continue to work on diet and exercise. If you change your mind regarding Flomax please let us know.

## 2017-08-05 NOTE — Assessment & Plan Note (Addendum)
History and exam is consistent with BPH.  Due for PSA to be checked.  Discussed trial of Flomax though he deferred this.  If he changes his mind he will let us know.

## 2017-08-05 NOTE — Progress Notes (Signed)
Tommi Rumps, MD Phone: [phone]  Jason Hayes is a 67 y.o. male who presents today for f/u and est care.  Patient notes cough since late last week with no significant congestion.  Notes it is worse at night.  Notes a Chakraborty tightness when he lays down at night though no chest pain or shortness of breath.  He notes he has been exercising with no difficulty.  Notes his wife has been sick.  He reports urinary frequency that is not as frequent if he exercises.  Notes nocturia one time per night when he does not exercise.  No straining.  Notes his stream is not as strong as previously.  Does note a history of elevated PSAs in the past with multiple biopsies that did not reveal cancer.  Most recent PSA has been normal.  Hyperlipidemia: Taking Lipitor.  No chest pain or shortness of breath.  He does try to exercise 3 days a week.  Does not really watch what he eats.  No prior hepatitis C testing.  He thinks he has had a tetanus vaccination in the last 10 years.  He declines flu shot.  Needs Pneumovax.  Up-to-date on colonoscopy though we will need to request the report.  No tobacco use, alcohol use, or illicit drug use.  Active Ambulatory Problems    Diagnosis Date Noted  . Hyperlipidemia 06/20/2015  . Prediabetes 12/18/2016  . BPH (benign prostatic hyperplasia) 08/05/2017  . Viral respiratory illness 08/05/2017   Resolved Ambulatory Problems    Diagnosis Date Noted  . Encounter for health maintenance examination with abnormal findings 01/22/2016   Past Medical History:  Diagnosis Date  . Asthma   . Colon polyps   . Hyperlipidemia   . Kidney stones     Family History  Problem Relation Age of Onset  . Hyperlipidemia Mother   . Hypertension Mother   . Diabetes Mother   . Heart disease Mother   . Hypertension Father   . Hyperlipidemia Father   . Diabetes Father   . Heart disease Father   . Hyperlipidemia Sister   . Hypertension Sister   . Diabetes Sister   . Cancer  Sister   . Ovarian cancer Sister   . Hypertension Brother   . Hyperlipidemia Brother   . Diabetes Brother   . Prostate cancer Brother   . Prostate cancer Brother   . Stroke Sister     Social History   Socioeconomic History  . Marital status: Married    Spouse name: Not on file  . Number of children: Not on file  . Years of education: Not on file  . Highest education level: Not on file  Social Needs  . Financial resource strain: Not on file  . Food insecurity - worry: Not on file  . Food insecurity - inability: Not on file  . Transportation needs - medical: Not on file  . Transportation needs - non-medical: Not on file  Occupational History  . Not on file  Tobacco Use  . Smoking status: Never Smoker  . Smokeless tobacco: Never Used  Substance and Sexual Activity  . Alcohol use: No    Alcohol/week: 0.0 oz  . Drug use: No  . Sexual activity: Yes    Partners: Female  Other Topics Concern  . Not on file  Social History Narrative  . Not on file    ROS  General:  Negative for nexplained weight loss, fever Skin: Negative for new or changing mole, sore that won't heal  HEENT: Negative for trouble hearing, trouble seeing, ringing in ears, mouth sores, hoarseness, change in voice, dysphagia. CV:  Negative for chest pain, dyspnea, edema, palpitations Resp: Positive for cough, negative for dyspnea, hemoptysis GI: Negative for nausea, vomiting, diarrhea, constipation, abdominal pain, melena, hematochezia. GU: Positive for frequency of urination, negative for dysuria, incontinence, urinary hesitance, hematuria, vaginal or penile discharge, sexual difficulty, lumps in testicle or breasts MSK: Negative for muscle cramps or aches, joint pain or swelling Neuro: Negative for headaches, weakness, numbness, dizziness, passing out/fainting Psych: Negative for depression, anxiety, memory problems  Objective  Physical Exam Vitals:   08/05/17 1406  BP: 112/68  Pulse: 64  Temp: 97.7  F (36.5 C)  SpO2: 99%    BP Readings from Last 3 Encounters:  08/05/17 112/68  07/23/16 116/64  01/21/16 118/62   Wt Readings from Last 3 Encounters:  08/05/17 198 lb (89.8 kg)  07/23/16 209 lb (94.8 kg)  01/21/16 202 lb 4 oz (91.7 kg)    Physical Exam  Constitutional: No distress.  HENT:  Mouth/Throat: Oropharynx is clear and moist. No oropharyngeal exudate.  Eyes: Conjunctivae are normal. Pupils are equal, round, and reactive to light.  Cardiovascular: Normal rate, regular rhythm and normal heart sounds.  Pulmonary/Chest: Effort normal and breath sounds normal.  Abdominal: Soft. Bowel sounds are normal. He exhibits no distension. There is no tenderness.  Genitourinary:  Genitourinary Comments: Normal rectum, mildly enlarged prostate with no nodules noted  Musculoskeletal: He exhibits no edema.  Neurological: He is alert. Gait normal.  Skin: Skin is warm and dry. He is not diaphoretic.  Psychiatric: Mood and affect normal.     Assessment/Plan:   Hyperlipidemia Check lipid panel.  Continue current medication.  BPH (benign prostatic hyperplasia) History and exam is consistent with BPH.  Due for PSA to be checked.  Discussed trial of Flomax though he deferred this.  If he changes his mind he will let us know.  Prediabetes Patient will continue to work on diet and exercise.  Viral respiratory illness Suspect viral respiratory illness with possible bronchitis given patient's cough and respiratory symptoms.  He notes no cardiac symptoms.  Discussed this should improve on its own.  He will monitor and if not improving he will let us know.   Orders Placed This Encounter  Procedures  . Comp Met (CMET)  . Lipid panel  . PSA, Medicare  . Hepatitis C Antibody  . HgB A1c    Meds ordered this encounter  Medications  . pneumococcal 23 valent vaccine (PNU-IMMUNE) 25 MCG/0.5ML injection    Sig: Inject 0.5 mLs into the muscle once for 1 dose.    Dispense:  0.5 mL     Refill:  0     Tommi Rumps, MD [place]

## 2017-08-05 NOTE — Assessment & Plan Note (Signed)
Check lipid panel.  Continue current medication. 

## 2017-08-05 NOTE — Progress Notes (Signed)
Pre visit review using our clinic review tool, if applicable. No additional management support is needed unless otherwise documented below in the visit note. 

## 2017-08-05 NOTE — Assessment & Plan Note (Signed)
Patient will continue to work on diet and exercise. °

## 2017-08-06 LAB — HEPATITIS C ANTIBODY
HEP C AB: NONREACTIVE
SIGNAL TO CUT-OFF: 0.01 (ref <1.00)

## 2017-08-14 ENCOUNTER — Telehealth: Payer: Self-pay | Admitting: Family Medicine

## 2017-08-14 MED ORDER — ATORVASTATIN CALCIUM 40 MG PO TABS: 40.0000 mg | ORAL_TABLET | Freq: Every day | ORAL | 3 refills | Status: DC

## 2017-08-14 NOTE — Telephone Encounter (Signed)
FYI

## 2017-08-14 NOTE — Telephone Encounter (Signed)
Forgot to put in result note: Pt agreeable to increasing Lipitor to 40 mg.

## 2017-08-14 NOTE — Addendum Note (Signed)
Addended by: Caryl Bis, [place] G on: 08/14/2017 01:01 PM   Modules accepted: Orders

## 2017-08-14 NOTE — Telephone Encounter (Signed)
Left message letting him know that medication was at the pharmacy

## 2017-08-14 NOTE — Telephone Encounter (Signed)
Sent to pharmacy 

## 2017-10-11 ENCOUNTER — Other Ambulatory Visit: Payer: Self-pay | Admitting: Family Medicine

## 2018-03-05 DIAGNOSIS — H40003 Preglaucoma, unspecified, bilateral: Secondary | ICD-10-CM | POA: Diagnosis not present

## 2018-04-12 ENCOUNTER — Other Ambulatory Visit: Payer: Self-pay

## 2018-04-12 MED ORDER — ATORVASTATIN CALCIUM 20 MG PO TABS: 20.0000 mg | ORAL_TABLET | Freq: Every day | ORAL | 1 refills | Status: DC

## 2018-08-06 ENCOUNTER — Encounter: Payer: BLUE CROSS/BLUE SHIELD | Admitting: Family Medicine

## 2018-08-06 ENCOUNTER — Telehealth: Payer: Self-pay | Admitting: Family Medicine

## 2018-08-06 ENCOUNTER — Encounter: Payer: Self-pay | Admitting: Family Medicine

## 2018-08-06 ENCOUNTER — Ambulatory Visit (INDEPENDENT_AMBULATORY_CARE_PROVIDER_SITE_OTHER): Payer: Medicare Other | Admitting: Family Medicine

## 2018-08-06 VITALS — BP 100/80 | HR 54 | Temp 97.4°F | Ht 70.5 in | Wt 201.2 lb

## 2018-08-06 DIAGNOSIS — E785 Hyperlipidemia, unspecified: Secondary | ICD-10-CM | POA: Diagnosis not present

## 2018-08-06 DIAGNOSIS — N401 Enlarged prostate with lower urinary tract symptoms: Secondary | ICD-10-CM

## 2018-08-06 DIAGNOSIS — R7303 Prediabetes: Secondary | ICD-10-CM

## 2018-08-06 DIAGNOSIS — R351 Nocturia: Secondary | ICD-10-CM | POA: Diagnosis not present

## 2018-08-06 DIAGNOSIS — Z1211 Encounter for screening for malignant neoplasm of colon: Secondary | ICD-10-CM

## 2018-08-06 DIAGNOSIS — E663 Overweight: Secondary | ICD-10-CM | POA: Diagnosis not present

## 2018-08-06 DIAGNOSIS — Z125 Encounter for screening for malignant neoplasm of prostate: Secondary | ICD-10-CM

## 2018-08-06 DIAGNOSIS — Z8042 Family history of malignant neoplasm of prostate: Secondary | ICD-10-CM | POA: Diagnosis not present

## 2018-08-06 DIAGNOSIS — Z130 Encounter for screening for diseases of the blood and blood-forming organs and certain disorders involving the immune mechanism: Secondary | ICD-10-CM

## 2018-08-06 DIAGNOSIS — Z1329 Encounter for screening for other suspected endocrine disorder: Secondary | ICD-10-CM

## 2018-08-06 LAB — COMPREHENSIVE METABOLIC PANEL
ALBUMIN: 4.5 g/dL (ref 3.5–5.2)
ALT: 22 U/L (ref 0–53)
AST: 30 U/L (ref 0–37)
Alkaline Phosphatase: 62 U/L (ref 39–117)
BUN: 17 mg/dL (ref 6–23)
CHLORIDE: 105 meq/L (ref 96–112)
CO2: 29 meq/L (ref 19–32)
CREATININE: 1.29 mg/dL (ref 0.40–1.50)
Calcium: 9.7 mg/dL (ref 8.4–10.5)
GFR: 71.18 mL/min (ref >60.00)
Glucose, Bld: 85 mg/dL (ref 70–99)
Potassium: 4 mEq/L (ref 3.5–5.1)
SODIUM: 141 meq/L (ref 135–145)
Total Bilirubin: 0.8 mg/dL (ref 0.2–1.2)
Total Protein: 7.2 g/dL (ref 6.0–8.3)

## 2018-08-06 LAB — TSH: TSH: 1.01 u[IU]/mL (ref 0.35–4.50)

## 2018-08-06 LAB — LIPID PANEL
CHOL/HDL RATIO: 3
CHOLESTEROL: 162 mg/dL (ref 0–200)
HDL: 58.6 mg/dL (ref >39.00)
LDL CALC: 95 mg/dL (ref 0–99)
NONHDL: 103.49
Triglycerides: 41 mg/dL (ref 0.0–149.0)
VLDL: 8.2 mg/dL (ref 0.0–40.0)

## 2018-08-06 LAB — POCT URINALYSIS DIPSTICK
BILIRUBIN UA: NEGATIVE
Blood, UA: NEGATIVE
GLUCOSE UA: NEGATIVE
Leukocytes, UA: NEGATIVE
Nitrite, UA: NEGATIVE
PH UA: 6 (ref 5.0–8.0)
Protein, UA: NEGATIVE
Spec Grav, UA: 1.02 (ref 1.010–1.025)
UROBILINOGEN UA: 0.2 U/dL

## 2018-08-06 LAB — HEMOGLOBIN A1C: Hgb A1c MFr Bld: 5.7 % (ref 4.6–6.5)

## 2018-08-06 LAB — PSA, MEDICARE: PSA: 29.96 ng/ml — ABNORMAL HIGH (ref 0.10–4.00)

## 2018-08-06 MED ORDER — TETANUS-DIPHTH-ACELL PERTUSSIS 5-2.5-18.5 LF-MCG/0.5 IM SUSP: 0.5000 mL | Freq: Once | INTRAMUSCULAR | 0 refills | Status: AC

## 2018-08-06 MED ORDER — PNEUMOCOCCAL VAC POLYVALENT 25 MCG/0.5ML IJ INJ: 0.5000 mL | INJECTION | Freq: Once | INTRAMUSCULAR | 0 refills | Status: AC

## 2018-08-06 NOTE — Progress Notes (Addendum)
Jason Rumps, MD Phone: [phone]  Jason Hayes is a 68 y.o. male who presents today for follow-up.Marland Kitchen  BPH: Patient notes sporadic nocturia where he will go 2-3 times at night though this will only occur 2-3 times a year.  The rest of the year he feels okay with his urination.  No straining.  He does feel like he empties.  Occasionally he will have an urge.  No issues with starting or stopping.  No dysuria.  Overweight: Exercises 3 days a week running 6 to 8 miles and lifting weights.  Also has an active part-time job on Tuesday and Thursday. Notes eats everything.  Does eat fried food and drink sweet tea.  Does get vegetables 2-3 times a week.  Family history of prostate cancer: Patient due for PSA.  Prediabetes: Patient is due for A1c.  Hyperlipidemia: Patient works on exercise.  Has not been monitoring diet.  Patient is due for lipid panel.   Patient reports he found his colonoscopy report.  Last done in 2012 with noncancerous polyps.  He does note they advised 3-year recall. Patient does report a family history of prostate cancer in 2 brothers.  Patient is due for PSA check. He is unsure regarding flu vaccine, tetanus vaccine, and pneumonia vaccine.   Hepatitis C screening up-to-date.   No tobacco use, alcohol use, or illicit drug use. He sees a Pharmacist, community twice yearly.  He sees an ophthalmologist every 6 months now due to peripheral vision issues.    Active Ambulatory Problems    Diagnosis Date Noted  . Hyperlipidemia 06/20/2015  . Encounter for general adult medical examination with abnormal findings 01/22/2016  . Prediabetes 12/18/2016  . BPH (benign prostatic hyperplasia) 08/05/2017  . Overweight 08/15/2018  . Family history of prostate cancer 08/15/2018   Resolved Ambulatory Problems    Diagnosis Date Noted  . Viral respiratory illness 08/05/2017   Past Medical History:  Diagnosis Date  . Asthma   . Colon polyps   . Kidney stones     Family History  Problem  Relation Age of Onset  . Hyperlipidemia Mother   . Hypertension Mother   . Diabetes Mother   . Heart disease Mother   . Hypertension Father   . Hyperlipidemia Father   . Diabetes Father   . Heart disease Father   . Hyperlipidemia Sister   . Hypertension Sister   . Diabetes Sister   . Cancer Sister   . Ovarian cancer Sister   . Hypertension Brother   . Hyperlipidemia Brother   . Diabetes Brother   . Prostate cancer Brother   . Prostate cancer Brother   . Stroke Sister     Social History   Socioeconomic History  . Marital status: Married    Spouse name: Not on file  . Number of children: Not on file  . Years of education: Not on file  . Highest education level: Not on file  Occupational History  . Not on file  Social Needs  . Financial resource strain: Not on file  . Food insecurity:    Worry: Not on file    Inability: Not on file  . Transportation needs:    Medical: Not on file    Non-medical: Not on file  Tobacco Use  . Smoking status: Never Smoker  . Smokeless tobacco: Never Used  Substance and Sexual Activity  . Alcohol use: No    Alcohol/week: 0.0 standard drinks  . Drug use: No  . Sexual activity: Yes  Partners: Female  Lifestyle  . Physical activity:    Days per week: Not on file    Minutes per session: Not on file  . Stress: Not on file  Relationships  . Social connections:    Talks on phone: Not on file    Gets together: Not on file    Attends religious service: Not on file    Active member of club or organization: Not on file    Attends meetings of clubs or organizations: Not on file    Relationship status: Not on file  . Intimate partner violence:    Fear of current or ex partner: Not on file    Emotionally abused: Not on file    Physically abused: Not on file    Forced sexual activity: Not on file  Other Topics Concern  . Not on file  Social History Narrative  . Not on file    ROS  General:  Negative for nexplained weight loss,  fever Skin: Negative for new or changing mole, sore that won't heal HEENT: Negative for trouble hearing, trouble seeing, ringing in ears, mouth sores, hoarseness, change in voice, dysphagia. CV:  Negative for chest pain, dyspnea, edema, palpitations Resp: Negative for cough, dyspnea, hemoptysis GI: Negative for nausea, vomiting, diarrhea, constipation, abdominal pain, melena, hematochezia. GU: Negative for dysuria, incontinence, urinary hesitance, hematuria, vaginal or penile discharge, polyuria, sexual difficulty, lumps in testicle or breasts MSK: Negative for muscle cramps or aches, joint pain or swelling Neuro: Negative for headaches, weakness, numbness, dizziness, passing out/fainting Psych: Negative for depression, anxiety, memory problems  Objective  Physical Exam Vitals:   08/06/18 1317  BP: 100/80  Pulse: (!) 54  Temp: (!) 97.4 F (36.3 C)  SpO2: 99%    BP Readings from Last 3 Encounters:  08/06/18 100/80  08/05/17 112/68  07/23/16 116/64   Wt Readings from Last 3 Encounters:  08/06/18 201 lb 3.2 oz (91.3 kg)  08/05/17 198 lb (89.8 kg)  07/23/16 209 lb (94.8 kg)    Physical Exam Constitutional:      General: He is not in acute distress.    Appearance: He is not diaphoretic.  HENT:     Head: Normocephalic and atraumatic.     Mouth/Throat:     Mouth: Mucous membranes are moist.     Pharynx: Oropharynx is clear.  Eyes:     Pupils: Pupils are equal, round, and reactive to light.  Neck:     Musculoskeletal: Neck supple.  Cardiovascular:     Rate and Rhythm: Normal rate and regular rhythm.     Heart sounds: Normal heart sounds.  Pulmonary:     Effort: Pulmonary effort is normal.     Breath sounds: Normal breath sounds.  Abdominal:     General: Bowel sounds are normal. There is no distension.     Palpations: Abdomen is soft.     Tenderness: There is no abdominal tenderness.  Genitourinary:    Rectum: Normal.     Comments: Prostate mildly enlarged, no  nodules Lymphadenopathy:     Cervical: No cervical adenopathy.  Skin:    General: Skin is warm and dry.  Neurological:     Mental Status: He is alert.  Psychiatric:        Mood and Affect: Mood normal.      Assessment/Plan:   BPH (benign prostatic hyperplasia) Symptoms are fairly infrequent.  I discussed that the benefit of the medication probably would not outweigh the risk of side effects from  the medication given his infrequent symptoms.  He will continue to monitor with no medication.  Overweight Encouraged dietary changes.  Continue with exercise.  Family history of prostate cancer PSA ordered.  Hyperlipidemia Lipid panel ordered.  Prediabetes A1c ordered.  Health maintenance: He was advised that if he would like the flu shot he can get it at the pharmacy.  Discussed the benefits of the flu shot.  Discussed tetanus vaccination and pneumonia vaccination as well.  He was given prescriptions to get those at the pharmacy.  Refer to GI for colonoscopy.  Orders Placed This Encounter  Procedures  . PSA, Medicare ( [place] [facility] only)  . Comp Met (CMET)  . Lipid panel  . TSH  . HgB A1c  . Ambulatory referral to Gastroenterology    Referral Priority:   Routine    Referral Type:   Consultation    Referral Reason:   Specialty Services Required    Number of Visits Requested:   1  . POCT Urinalysis Dipstick    Meds ordered this encounter  Medications  . Tdap (BOOSTRIX) 5-2.5-18.5 LF-MCG/0.5 injection    Sig: Inject 0.5 mLs into the muscle once for 1 dose.    Dispense:  0.5 mL    Refill:  0  . pneumococcal 23 valent vaccine (PNU-IMMUNE) 25 MCG/0.5ML injection    Sig: Inject 0.5 mLs into the muscle once for 1 dose.    Dispense:  0.5 mL    Refill:  0     Jason Rumps, MD [place]

## 2018-08-06 NOTE — Telephone Encounter (Signed)
Copied from [place] [id number]. Topic: Quick Communication - See Telephone Encounter >> Aug 06, 2018  5:00 PM Blase Mess A wrote: CRM for notification. See Telephone encounter for: 08/06/18.  Patient is calling to report where he had his last colonoscopy [facility] Dr. Wendee Copp Hashmi 11/27/10 [address] [place], [place] [address] [phone]

## 2018-08-06 NOTE — Patient Instructions (Addendum)
Nice to see you. Please try to cut down on fried food and sweet tea.  Please continue with exercise. Please see the pharmacy for high-dose flu shot as well as tetanus vaccination and Pneumovax. If you would like to consider Flomax please let us know. We will get lab work today and contact you with the results. Somebody should contact you about seeing GI.

## 2018-08-07 NOTE — Assessment & Plan Note (Deleted)
Physical exam completed.  Encouraged dietary changes.  He will continue exercising.  Prostate cancer screening will be completed.  He was advised that if he would like the flu shot he can get it at the pharmacy.  Discussed the benefits of the flu shot.  Discussed tetanus vaccination and pneumonia vaccination as well.  He was given prescriptions to get those at the pharmacy.  Lab work as outlined below.

## 2018-08-07 NOTE — Assessment & Plan Note (Signed)
Symptoms are fairly infrequent.  I discussed that the benefit of the medication probably would not outweigh the risk of side effects from the medication given his infrequent symptoms.  He will continue to monitor with no medication.

## 2018-08-09 ENCOUNTER — Other Ambulatory Visit: Payer: Self-pay

## 2018-08-09 ENCOUNTER — Telehealth: Payer: Self-pay | Admitting: Family Medicine

## 2018-08-09 DIAGNOSIS — Z8042 Family history of malignant neoplasm of prostate: Secondary | ICD-10-CM

## 2018-08-09 DIAGNOSIS — R972 Elevated prostate specific antigen [PSA]: Secondary | ICD-10-CM

## 2018-08-09 DIAGNOSIS — Z86010 Personal history of colonic polyps: Secondary | ICD-10-CM

## 2018-08-09 NOTE — Telephone Encounter (Signed)
Pt called back to speak with PCP

## 2018-08-09 NOTE — Telephone Encounter (Signed)
I attempted to contact the patient. There was no answer and I left a message asking him to call back to the office with no identifying information. When he calls back please let me know so that I can speak with him regarding his labs.

## 2018-08-09 NOTE — Telephone Encounter (Signed)
abstracted into patient's chart sent to PCP as an FYI.

## 2018-08-09 NOTE — Telephone Encounter (Signed)
I spoke with the patient regarding his lab results.  Advised that his PSA was elevated.  He notes that he has had this previously where his PSA had gone up to 250.  He had a biopsy at that time that he reports was negative.  His PSA returned to normal.  Discussed that he would need to be evaluated by urology for this.  I have placed a referral.  His other lab work is acceptable.

## 2018-08-09 NOTE — Telephone Encounter (Signed)
Noted.  Referred to GI previously for follow-up colonoscopy.

## 2018-08-13 ENCOUNTER — Encounter: Payer: Self-pay | Admitting: Family Medicine

## 2018-08-15 DIAGNOSIS — E663 Overweight: Secondary | ICD-10-CM | POA: Insufficient documentation

## 2018-08-15 DIAGNOSIS — Z8042 Family history of malignant neoplasm of prostate: Secondary | ICD-10-CM | POA: Insufficient documentation

## 2018-08-15 NOTE — Assessment & Plan Note (Signed)
Encouraged dietary changes.  Continue with exercise.

## 2018-08-15 NOTE — Addendum Note (Signed)
Addended by: Caryl Bis Treyshaun Keatts G on: 08/15/2018 12:35 PM   Modules accepted: Level of Service

## 2018-08-15 NOTE — Assessment & Plan Note (Signed)
PSA ordered

## 2018-08-15 NOTE — Assessment & Plan Note (Signed)
Lipid panel ordered.

## 2018-08-15 NOTE — Assessment & Plan Note (Signed)
A1c ordered.

## 2018-08-16 ENCOUNTER — Telehealth: Payer: Self-pay

## 2018-08-16 NOTE — Telephone Encounter (Signed)
Copied from [place] [id number]. Topic: General - Other >> Aug 16, 2018 11:42 AM Rayann Heman wrote: Reason for CRM: pt called and stated that he received a call from the office about urology.  Pt would like a call back regarding.Please advise

## 2018-08-17 NOTE — Telephone Encounter (Signed)
Melissa did you or urology try to contact patient about referral?  Looks like Dr. Caryl Bis placed a referral unsure if you called him or urology did.

## 2018-08-17 NOTE — Telephone Encounter (Signed)
I did not call him. It must of been urology.

## 2018-08-19 NOTE — Telephone Encounter (Signed)
Called and spoke to patient's wife. Jason Hayes is not home advised wife of the situation she took down information and plans to inform her husband.

## 2018-08-19 NOTE — Telephone Encounter (Signed)
Will inform pt to call [facility] @ [phone] they tried to reach out to him to make an appt.

## 2018-08-21 ENCOUNTER — Encounter: Payer: Self-pay | Admitting: Anesthesiology

## 2018-08-23 ENCOUNTER — Ambulatory Visit
Admission: RE | Admit: 2018-08-23 | Discharge: 2018-08-23 | Disposition: A | Discharge status: Home / Self Care | Payer: Medicare Other | Attending: Gastroenterology | Admitting: Gastroenterology

## 2018-08-23 ENCOUNTER — Encounter
Admission: RE | Disposition: A | Discharge status: Home / Self Care | Payer: Self-pay | Source: Home / Self Care | Attending: Gastroenterology

## 2018-08-23 ENCOUNTER — Ambulatory Visit: Payer: Medicare Other | Admitting: Anesthesiology

## 2018-08-23 DIAGNOSIS — D123 Benign neoplasm of transverse colon: Secondary | ICD-10-CM | POA: Diagnosis not present

## 2018-08-23 DIAGNOSIS — J45909 Unspecified asthma, uncomplicated: Secondary | ICD-10-CM | POA: Diagnosis not present

## 2018-08-23 DIAGNOSIS — Z1211 Encounter for screening for malignant neoplasm of colon: Secondary | ICD-10-CM | POA: Diagnosis not present

## 2018-08-23 DIAGNOSIS — Z79899 Other long term (current) drug therapy: Secondary | ICD-10-CM | POA: Insufficient documentation

## 2018-08-23 DIAGNOSIS — E785 Hyperlipidemia, unspecified: Secondary | ICD-10-CM | POA: Insufficient documentation

## 2018-08-23 DIAGNOSIS — Z86010 Personal history of colonic polyps: Secondary | ICD-10-CM | POA: Diagnosis not present

## 2018-08-23 DIAGNOSIS — Z87442 Personal history of urinary calculi: Secondary | ICD-10-CM | POA: Diagnosis not present

## 2018-08-23 DIAGNOSIS — D126 Benign neoplasm of colon, unspecified: Secondary | ICD-10-CM | POA: Diagnosis not present

## 2018-08-23 DIAGNOSIS — K635 Polyp of colon: Secondary | ICD-10-CM | POA: Diagnosis not present

## 2018-08-23 HISTORY — PX: COLONOSCOPY WITH PROPOFOL: SHX5780

## 2018-08-23 SURGERY — COLONOSCOPY WITH PROPOFOL
Anesthesia: General

## 2018-08-23 MED ORDER — LIDOCAINE HCL (PF) 2 % IJ SOLN
INTRAMUSCULAR | Status: AC
  Filled 2018-08-23: qty 10

## 2018-08-23 MED ORDER — PROPOFOL 500 MG/50ML IV EMUL
INTRAVENOUS | Status: DC | PRN
  Administered 2018-08-23: 125 ug/kg/min via INTRAVENOUS

## 2018-08-23 MED ORDER — PROPOFOL 500 MG/50ML IV EMUL
INTRAVENOUS | Status: AC
  Filled 2018-08-23: qty 50

## 2018-08-23 MED ORDER — PROPOFOL 10 MG/ML IV BOLUS
INTRAVENOUS | Status: DC | PRN
  Administered 2018-08-23 (×2): 50 mg via INTRAVENOUS

## 2018-08-23 MED ORDER — LIDOCAINE HCL (CARDIAC) PF 100 MG/5ML IV SOSY
PREFILLED_SYRINGE | INTRAVENOUS | Status: DC | PRN
  Administered 2018-08-23: 30 mg via INTRAVENOUS

## 2018-08-23 MED ORDER — SODIUM CHLORIDE 0.9 % IV SOLN
INTRAVENOUS | Status: DC
  Administered 2018-08-23: 10:00:00 via INTRAVENOUS
  Administered 2018-08-23: 1000 mL via INTRAVENOUS

## 2018-08-23 NOTE — Transfer of Care (Signed)
Immediate Anesthesia Transfer of Care Note  Patient: Jason Hayes  Procedure(s) Performed: COLONOSCOPY WITH PROPOFOL (N/A )  Patient Location: PACU and Endoscopy Unit  Anesthesia Type:General  Level of Consciousness: awake, alert  and oriented  Airway & Oxygen Therapy: Patient Spontanous Breathing and Patient connected to nasal cannula oxygen  Post-op Assessment: Report given to RN and Post -op Vital signs reviewed and stable  Post vital signs: Reviewed and stable  Last Vitals:  Vitals Value Taken Time  BP    Temp    Pulse    Resp    SpO2      Last Pain:  Vitals:   08/23/18 0828  TempSrc: Tympanic  PainSc: 0-No pain         Complications: No apparent anesthesia complications

## 2018-08-23 NOTE — H&P (Signed)
Jonathon Bellows, MD [address], [place], [place], [place], [address] [address], [place], [place], [place], [address] Phone: [phone]  Fax: [phone]  Primary Care Physician:  Leone Haven, MD   Pre-Procedure History & Physical: HPI:  Jason Hayes is a 68 y.o. male is here for an colonoscopy.   Past Medical History:  Diagnosis Date  . Asthma   . Colon polyps   . Hyperlipidemia   . Kidney stones     Past Surgical History:  Procedure Laterality Date  . EYE SURGERY    . FOOT SURGERY    . HERNIA REPAIR    . KNEE SURGERY      Prior to Admission medications   Medication Sig Start Date End Date Taking? Authorizing Provider  atorvastatin (LIPITOR) 20 MG tablet Take 1 tablet (20 mg total) by mouth at bedtime. 04/12/18  Yes Leone Haven, MD    Allergies as of 08/09/2018  . (No Known Allergies)    Family History  Problem Relation Age of Onset  . Hyperlipidemia Mother   . Hypertension Mother   . Diabetes Mother   . Heart disease Mother   . Hypertension Father   . Hyperlipidemia Father   . Diabetes Father   . Heart disease Father   . Hyperlipidemia Sister   . Hypertension Sister   . Diabetes Sister   . Cancer Sister   . Ovarian cancer Sister   . Hypertension Brother   . Hyperlipidemia Brother   . Diabetes Brother   . Prostate cancer Brother   . Prostate cancer Brother   . Stroke Sister     Social History   Socioeconomic History  . Marital status: Married    Spouse name: Not on file  . Number of children: Not on file  . Years of education: Not on file  . Highest education level: Not on file  Occupational History  . Not on file  Social Needs  . Financial resource strain: Not on file  . Food insecurity:    Worry: Not on file    Inability: Not on file  . Transportation needs:    Medical: Not on file    Non-medical: Not on file  Tobacco Use  . Smoking status: Never Smoker  . Smokeless tobacco: Never Used  Substance and Sexual  Activity  . Alcohol use: No    Alcohol/week: 0.0 standard drinks  . Drug use: No  . Sexual activity: Yes    Partners: Female  Lifestyle  . Physical activity:    Days per week: Not on file    Minutes per session: Not on file  . Stress: Not on file  Relationships  . Social connections:    Talks on phone: Not on file    Gets together: Not on file    Attends religious service: Not on file    Active member of club or organization: Not on file    Attends meetings of clubs or organizations: Not on file    Relationship status: Not on file  . Intimate partner violence:    Fear of current or ex partner: Not on file    Emotionally abused: Not on file    Physically abused: Not on file    Forced sexual activity: Not on file  Other Topics Concern  . Not on file  Social History Narrative  . Not on file    Review of Systems: See HPI, otherwise negative ROS  Physical Exam: BP 114/77   Pulse Marland Kitchen)  51   Temp (!) 96.1 F (35.6 C) (Tympanic)   Resp 20   Ht 5\' 11"  (1.803 m)   Wt 90.7 kg   SpO2 100%   BMI 27.89 kg/m  General:   Alert,  pleasant and cooperative in NAD Head:  Normocephalic and atraumatic. Neck:  Supple; no masses or thyromegaly. Lungs:  Clear throughout to auscultation, normal respiratory effort.    Heart:  +S1, +S2, Regular rate and rhythm, No edema. Abdomen:  Soft, nontender and nondistended. Normal bowel sounds, without guarding, and without rebound.   Neurologic:  Alert and  oriented x4;  grossly normal neurologically.  Impression/Plan: Jason Hayes is here for an colonoscopy to be performed for surveillance due to prior history of colon polyps   Risks, benefits, limitations, and alternatives regarding  colonoscopy have been reviewed with the patient.  Questions have been answered.  All parties agreeable.   Jonathon Bellows, MD  08/23/2018, 9:40 AM

## 2018-08-23 NOTE — Anesthesia Postprocedure Evaluation (Signed)
Anesthesia Post Note  Patient: Jason Hayes  Procedure(s) Performed: COLONOSCOPY WITH PROPOFOL (N/A )  Patient location during evaluation: Endoscopy Anesthesia Type: General Level of consciousness: awake and alert Pain management: pain level controlled Vital Signs Assessment: post-procedure vital signs reviewed and stable Respiratory status: spontaneous breathing, nonlabored ventilation, respiratory function stable and patient connected to nasal cannula oxygen Cardiovascular status: blood pressure returned to baseline and stable Postop Assessment: no apparent nausea or vomiting Anesthetic complications: no     Last Vitals:  Vitals:   08/23/18 1038 08/23/18 1048  BP: 129/75   Pulse: (!) 49 (!) 50  Resp: 14 12  Temp:    SpO2: 100% 100%    Last Pain:  Vitals:   08/23/18 1038  TempSrc:   PainSc: 0-No pain                 Nica Friske S

## 2018-08-23 NOTE — Anesthesia Post-op Follow-up Note (Signed)
Anesthesia QCDR form completed.        

## 2018-08-23 NOTE — Anesthesia Preprocedure Evaluation (Signed)
Anesthesia Evaluation  Patient identified by MRN, date of birth, ID band Patient awake    Reviewed: Allergy & Precautions, NPO status , Patient's Chart, lab work & pertinent test results, reviewed documented beta blocker date and time   Airway Mallampati: III  TM Distance: >3 FB     Dental  (+) Chipped   Pulmonary asthma ,           Cardiovascular      Neuro/Psych    GI/Hepatic   Endo/Other    Renal/GU Renal disease     Musculoskeletal   Abdominal   Peds  Hematology   Anesthesia Other Findings   Reproductive/Obstetrics                             Anesthesia Physical Anesthesia Plan  ASA: III  Anesthesia Plan: General   Post-op Pain Management:    Induction: Intravenous  PONV Risk Score and Plan:   Airway Management Planned:   Additional Equipment:   Intra-op Plan:   Post-operative Plan:   Informed Consent: I have reviewed the patients History and Physical, chart, labs and discussed the procedure including the risks, benefits and alternatives for the proposed anesthesia with the patient or authorized representative who has indicated his/her understanding and acceptance.       Plan Discussed with: CRNA  Anesthesia Plan Comments:         Anesthesia Quick Evaluation

## 2018-08-23 NOTE — Op Note (Signed)
[facility] Patient Name: Jason Hayes Procedure Date: 08/23/2018 9:43 AM MRN: [id number] Account #: [id number] Date of Birth: [birth year] Admit Type: Outpatient Age: 68 Room: [facility] ENDO ROOM 4 Gender: Male Note Status: Finalized Procedure:            Colonoscopy Indications:          High risk colon cancer surveillance: Personal history                        of colonic polyps Providers:            Jonathon Bellows MD, MD Referring MD:         Angela Adam. Caryl Bis (Referring MD) Medicines:            Monitored Anesthesia Care Complications:        No immediate complications. Procedure:            Pre-Anesthesia Assessment:                       - Prior to the procedure, a History and Physical was                        performed, and patient medications, allergies and                        sensitivities were reviewed. The patient's tolerance of                        previous anesthesia was reviewed.                       - The risks and benefits of the procedure and the                        sedation options and risks were discussed with the                        patient. All questions were answered and informed                        consent was obtained.                       - ASA Grade Assessment: II - A patient with mild                        systemic disease.                       After obtaining informed consent, the colonoscope was                        passed under direct vision. Throughout the procedure,                        the patient's blood pressure, pulse, and oxygen                        saturations were monitored continuously. The                        Colonoscope  was introduced through the anus and                        advanced to the the cecum, identified by the                        appendiceal orifice, IC valve and transillumination.                        The colonoscopy was performed with ease. The patient                         tolerated the procedure well. The quality of the bowel                        preparation was good. Findings:      The perianal and digital rectal examinations were normal.      Two sessile polyps were found in the transverse colon and hepatic       flexure. The polyps were 4 to 9 mm in size. These polyps were removed       with a cold snare. Resection and retrieval were complete.      The exam was otherwise without abnormality on direct and retroflexion       views. Impression:           - Two 4 to 9 mm polyps in the transverse colon and at                        the hepatic flexure, removed with a cold snare.                        Resected and retrieved.                       - The entire examined colon is normal on direct and                        retroflexion views. Recommendation:       - Discharge patient to home (with escort).                       - Resume previous diet.                       - Continue present medications.                       - Await pathology results.                       - Repeat colonoscopy in 5 years for surveillance based                        on pathology results. Procedure Code(s):    --- Professional ---                       [phone], Colonoscopy, flexible; with removal of tumor(s),                        polyp(s), or other lesion(s) by snare technique Diagnosis Code(s):    ---  Professional ---                       Z86.010, Personal history of colonic polyps                       D12.3, Benign neoplasm of transverse colon (hepatic                        flexure or splenic flexure) CPT copyright 2018 American Medical Association. All rights reserved. The codes documented in this report are preliminary and upon coder review may  be revised to meet current compliance requirements. Jonathon Bellows, MD Jonathon Bellows MD, MD 08/23/2018 10:06:13 AM This report has been signed electronically. Number of Addenda: 0 Note Initiated On: 08/23/2018 9:43  AM Scope Withdrawal Time: 0 hours 13 minutes 41 seconds  Total Procedure Duration: 0 hours 17 minutes 34 seconds       [facility]

## 2018-08-24 ENCOUNTER — Encounter: Payer: Self-pay | Admitting: Gastroenterology

## 2018-08-24 LAB — SURGICAL PATHOLOGY

## 2018-09-01 DIAGNOSIS — H40003 Preglaucoma, unspecified, bilateral: Secondary | ICD-10-CM | POA: Diagnosis not present

## 2018-09-10 ENCOUNTER — Encounter: Payer: Self-pay | Admitting: Urology

## 2018-09-10 ENCOUNTER — Ambulatory Visit (INDEPENDENT_AMBULATORY_CARE_PROVIDER_SITE_OTHER): Payer: Medicare Other | Admitting: Urology

## 2018-09-10 VITALS — BP 133/80 | HR 54 | Ht 71.0 in | Wt 203.2 lb

## 2018-09-10 DIAGNOSIS — N401 Enlarged prostate with lower urinary tract symptoms: Secondary | ICD-10-CM | POA: Diagnosis not present

## 2018-09-10 DIAGNOSIS — R972 Elevated prostate specific antigen [PSA]: Secondary | ICD-10-CM | POA: Diagnosis not present

## 2018-09-10 DIAGNOSIS — Z8042 Family history of malignant neoplasm of prostate: Secondary | ICD-10-CM | POA: Diagnosis not present

## 2018-09-10 DIAGNOSIS — R351 Nocturia: Secondary | ICD-10-CM

## 2018-09-10 NOTE — Progress Notes (Signed)
09/10/2018 9:48 AM   Arie Sabina 1950-11-30 [id number]  Referring provider: Leone Haven, MD [address] [place], [place] [address]  Chief Complaint  Patient presents with  . Elevated PSA    HPI: Jason Hayes is a 68 y.o. male seen at the request of Dr. Caryl Bis for evaluation of an elevated PSA.  A PSA drawn on 08/06/2018 was elevated at 29.96.  Most recent PSA results: 06/2015 1.7 07/2016 2.37 08/2017  3.06 08/2018  29.96  He has mild lower urinary tract symptoms including urinary frequency, urgency and nocturia x1-2.  No recent worsening of the symptoms.  Urinalysis at the time his PSA was drawn was normal.   He states approximately 11 years ago while living in [place] he had an elevated PSA that was as high as 150.  He states he underwent at least 3 biopsies which were all benign and his PSA subsequently returned to normal.   PMH: Past Medical History:  Diagnosis Date  . Asthma   . Colon polyps   . Hyperlipidemia   . Kidney stones     Surgical History: Past Surgical History:  Procedure Laterality Date  . COLONOSCOPY WITH PROPOFOL N/A 08/23/2018   Procedure: COLONOSCOPY WITH PROPOFOL;  Surgeon: Jonathon Bellows, MD;  Location: [facility] ENDOSCOPY;  Service: Gastroenterology;  Laterality: N/A;  . EYE SURGERY    . FOOT SURGERY    . HERNIA REPAIR    . KNEE SURGERY      Home Medications:  Allergies as of 09/10/2018   No Known Allergies     Medication List       Accurate as of September 10, 2018 11:59 PM. Always use your most recent med list.        atorvastatin 20 MG tablet Commonly known as:  LIPITOR Take 1 tablet (20 mg total) by mouth at bedtime.       Allergies: No Known Allergies  Family History: Family History  Problem Relation Age of Onset  . Hyperlipidemia Mother   . Hypertension Mother   . Diabetes Mother   . Heart disease Mother   . Hypertension Father   . Hyperlipidemia Father   . Diabetes Father   . Heart disease Father    . Hyperlipidemia Sister   . Hypertension Sister   . Diabetes Sister   . Cancer Sister   . Ovarian cancer Sister   . Hypertension Brother   . Hyperlipidemia Brother   . Diabetes Brother   . Prostate cancer Brother   . Prostate cancer Brother   . Stroke Sister     Social History:  reports that he has never smoked. He has never used smokeless tobacco. He reports that he does not drink alcohol or use drugs.  ROS: UROLOGY Frequent Urination?: Yes Hard to postpone urination?: Yes Burning/pain with urination?: No Get up at night to urinate?: Yes Leakage of urine?: No Urine stream starts and stops?: No Trouble starting stream?: No Do you have to strain to urinate?: No Blood in urine?: No Urinary tract infection?: No Sexually transmitted disease?: No Injury to kidneys or bladder?: No Painful intercourse?: No Weak stream?: No Erection problems?: No Penile pain?: No  Gastrointestinal Nausea?: No Vomiting?: No Indigestion/heartburn?: No Diarrhea?: No Constipation?: No  Constitutional Fever: No Night sweats?: No Weight loss?: No Fatigue?: No  Skin Skin rash/lesions?: No Itching?: No  Eyes Blurred vision?: No Double vision?: No  Ears/Nose/Throat Sore throat?: No Sinus problems?: No  Hematologic/Lymphatic Swollen glands?: No Easy bruising?: No  Cardiovascular Leg swelling?: No Chest pain?: No  Respiratory Cough?: No Shortness of breath?: No  Endocrine Excessive thirst?: No  Musculoskeletal Back pain?: No Joint pain?: No  Neurological Headaches?: No Dizziness?: No  Psychologic Depression?: No Anxiety?: No  Physical Exam: BP 133/80 (BP Location: Left Arm, Patient Position: Sitting, Cuff Size: Normal)   Pulse (!) 54   Ht 5\' 11"  (1.803 m)   Wt 203 lb 3.2 oz (92.2 kg)   BMI 28.34 kg/m   Constitutional:  Alert and oriented, No acute distress. HEENT: [place] AT, moist mucus membranes.  Trachea midline, no masses. Cardiovascular: No clubbing,  cyanosis, or edema. Respiratory: Normal respiratory effort, no increased work of breathing. GI: Abdomen is soft, nontender, nondistended, no abdominal masses GU: No CVA tenderness.  Prostate 60 g, smooth without nodules Lymph: No cervical or inguinal lymphadenopathy. Skin: No rashes, bruises or suspicious lesions. Neurologic: Grossly intact, no focal deficits, moving all 4 extremities. Psychiatric: Normal mood and affect.  Laboratory Data:  Lab Results  Component Value Date   CREATININE 1.29 08/06/2018    Lab Results  Component Value Date   PSA 29.96 (H) 08/06/2018   PSA 3.06 08/05/2017   PSA 2.37 07/23/2016    Assessment & Plan:   68 year old male with a significantly elevated PSA and a prior history of significant PSA elevation with benign prostate biopsy.  These records are not available for review.  Since his most recent PSA was approximately 1 month ago we will repeat today and if it remains persistently elevated I recommend scheduling a prostate MRI with fusion biopsy if abnormalities are identified.   Abbie Sons, [place] [address], [place], [place] [address] [phone]

## 2018-09-10 NOTE — Patient Instructions (Signed)

## 2018-09-11 LAB — PSA: Prostate Specific Ag, Serum: 4 ng/mL (ref 0.0–4.0)

## 2018-09-12 ENCOUNTER — Other Ambulatory Visit: Payer: Self-pay | Admitting: Urology

## 2018-09-12 ENCOUNTER — Encounter: Payer: Self-pay | Admitting: Urology

## 2018-09-12 DIAGNOSIS — R972 Elevated prostate specific antigen [PSA]: Secondary | ICD-10-CM

## 2018-09-13 ENCOUNTER — Telehealth: Payer: Self-pay | Admitting: Family Medicine

## 2018-09-13 NOTE — Telephone Encounter (Signed)
LMOM for patient to return call.

## 2018-09-13 NOTE — Telephone Encounter (Signed)
-----   Message from Abbie Sons, MD sent at 09/12/2018  1:38 PM EST ----- Repeat PSA looks much better at 4.0.  Recommend repeating PSA in 6-8 weeks.  Please schedule lab visit.  Order was entered.

## 2018-09-14 NOTE — Telephone Encounter (Signed)
Pt called office and I read message from [facility].  Lab appt was scheduled.

## 2018-09-14 NOTE — Telephone Encounter (Signed)
App had already been made   [employer]

## 2018-09-14 NOTE — Telephone Encounter (Signed)
-----   Message from Abbie Sons, MD sent at 09/12/2018  1:38 PM EST ----- Repeat PSA looks much better at 4.0.  Recommend repeating PSA in 6-8 weeks.  Please schedule lab visit.  Order was entered.

## 2018-10-08 ENCOUNTER — Other Ambulatory Visit: Payer: Self-pay | Admitting: Family Medicine

## 2018-11-01 ENCOUNTER — Other Ambulatory Visit: Payer: Self-pay

## 2018-11-01 ENCOUNTER — Other Ambulatory Visit: Payer: Medicare Other

## 2018-11-01 DIAGNOSIS — R972 Elevated prostate specific antigen [PSA]: Secondary | ICD-10-CM

## 2018-11-02 ENCOUNTER — Telehealth: Payer: Self-pay

## 2018-11-02 LAB — PSA: Prostate Specific Ag, Serum: 2.7 ng/mL (ref 0.0–4.0)

## 2018-11-02 NOTE — Telephone Encounter (Signed)
-----   Message from Abbie Sons, MD sent at 11/02/2018  2:27 PM EDT ----- PSA has continued to improve and was normal at 2.7.  Would recommend he resume annual PSA.  I am happy to see him here annually or he can see Dr. Caryl Bis and return here prn

## 2018-11-02 NOTE — Telephone Encounter (Signed)
Called pt informed him of the information below. Pt gave verbal understanding.  

## 2019-03-27 ENCOUNTER — Other Ambulatory Visit: Payer: Self-pay | Admitting: Family Medicine

## 2019-05-26 ENCOUNTER — Other Ambulatory Visit: Payer: Self-pay

## 2019-05-26 DIAGNOSIS — Z20828 Contact with and (suspected) exposure to other viral communicable diseases: Secondary | ICD-10-CM | POA: Diagnosis not present

## 2019-05-26 DIAGNOSIS — Z20822 Contact with and (suspected) exposure to covid-19: Secondary | ICD-10-CM

## 2019-05-28 LAB — NOVEL CORONAVIRUS, NAA: SARS-CoV-2, NAA: NOT DETECTED

## 2019-08-08 ENCOUNTER — Ambulatory Visit (INDEPENDENT_AMBULATORY_CARE_PROVIDER_SITE_OTHER): Payer: Medicare Other | Admitting: Family Medicine

## 2019-08-08 ENCOUNTER — Encounter: Payer: BLUE CROSS/BLUE SHIELD | Admitting: Family Medicine

## 2019-08-08 ENCOUNTER — Other Ambulatory Visit: Payer: Self-pay

## 2019-08-08 ENCOUNTER — Encounter: Payer: Self-pay | Admitting: Family Medicine

## 2019-08-08 VITALS — Ht 71.0 in | Wt 190.0 lb

## 2019-08-08 DIAGNOSIS — R7303 Prediabetes: Secondary | ICD-10-CM | POA: Diagnosis not present

## 2019-08-08 DIAGNOSIS — Z0000 Encounter for general adult medical examination without abnormal findings: Secondary | ICD-10-CM | POA: Diagnosis not present

## 2019-08-08 DIAGNOSIS — Z8042 Family history of malignant neoplasm of prostate: Secondary | ICD-10-CM

## 2019-08-08 DIAGNOSIS — E785 Hyperlipidemia, unspecified: Secondary | ICD-10-CM

## 2019-08-08 DIAGNOSIS — Z125 Encounter for screening for malignant neoplasm of prostate: Secondary | ICD-10-CM

## 2019-08-08 NOTE — Progress Notes (Signed)
Virtual Visit via video Note  This visit type was conducted due to national recommendations for restrictions regarding the COVID-19 pandemic (e.g. social distancing).  This format is felt to be most appropriate for this patient at this time.  All issues noted in this document were discussed and addressed.  No physical exam was performed (except for noted visual exam findings with Video Visits).   I connected with Jason Hayes today at  1:15 PM EST by a video enabled telemedicine application and verified that I am speaking with the correct person using two identifiers. Location patient: home Location provider: work Persons participating in the virtual visit: patient, provider  I discussed the limitations, risks, security and privacy concerns of performing an evaluation and management service by telephone and the availability of in person appointments. I also discussed with the patient that there may be a patient responsible charge related to this service. The patient expressed understanding and agreed to proceed.  Reason for visit: CPE.  HPI: Hyperlipidemia: Taking Lipitor.  No chest pain, claudication, right upper quadrant pain, or myalgias. Prediabetes: Some polyuria though no polydipsia.  He notes he is not going to the gym now and thinks his increased urination is because he is not sweating as much. Overweight: Patient has been exercising at home doing body weight exercises and walking.  Diet consists of eating pretty much anything.  He does drink 16 ounces of sweet tea daily.  Lots of peanuts.  Occasional desserts.  Eating vegetables most days.  Mostly chicken and some pork though occasional beef. Colonoscopy is up-to-date 08/23/2018 with 5-year recall.  No family history of colon cancer. Due for prostate cancer screening.  He does report family history of prostate cancer in 2 brothers. He declines flu vaccine.  Due for Pneumovax. No tobacco use, alcohol use, or illicit drug use. He sees  a Pharmacist, community and an ophthalmologist.   ROS: See pertinent positives and negatives per HPI.  Past Medical History:  Diagnosis Date  . Asthma   . Colon polyps   . Hyperlipidemia   . Kidney stones     Past Surgical History:  Procedure Laterality Date  . COLONOSCOPY WITH PROPOFOL N/A 08/23/2018   Procedure: COLONOSCOPY WITH PROPOFOL;  Surgeon: Jonathon Bellows, MD;  Location: [facility] ENDOSCOPY;  Service: Gastroenterology;  Laterality: N/A;  . EYE SURGERY    . FOOT SURGERY    . HERNIA REPAIR    . KNEE SURGERY      Family History  Problem Relation Age of Onset  . Hyperlipidemia Mother   . Hypertension Mother   . Diabetes Mother   . Heart disease Mother   . Hypertension Father   . Hyperlipidemia Father   . Diabetes Father   . Heart disease Father   . Hyperlipidemia Sister   . Hypertension Sister   . Diabetes Sister   . Cancer Sister   . Ovarian cancer Sister   . Hypertension Brother   . Hyperlipidemia Brother   . Diabetes Brother   . Prostate cancer Brother   . Prostate cancer Brother   . Stroke Sister     SOCIAL HX: Non-smoker   Current Outpatient Medications:  .  atorvastatin (LIPITOR) 20 MG tablet, TAKE 1 TABLET AT BEDTIME., Disp: 90 tablet, Rfl: 1  EXAM:  VITALS per patient if applicable:  GENERAL: alert, oriented, appears well and in no acute distress  HEENT: atraumatic, conjunttiva clear, no obvious abnormalities on inspection of external nose and ears  NECK: normal movements of the  head and neck  LUNGS: on inspection no signs of respiratory distress, breathing rate appears normal, no obvious gross SOB, gasping or wheezing  CV: no obvious cyanosis  MS: moves all visible extremities without noticeable abnormality  PSYCH/NEURO: pleasant and cooperative, no obvious depression or anxiety, speech and thought processing grossly intact  ASSESSMENT AND PLAN:  Discussed the following assessment and plan:  Routine general medical examination at a health care  facility Physical exam completed.  Plan for lab work in the next several weeks.  Encouraged continued exercise and discussed dietary changes.  We will check a PSA given his family history of prostate cancer.  Consider flu vaccine and pneumonia vaccine when he comes in for labs.  CMA will check with patient to make sure he would like to do these.  Lab work as outlined below.   Orders Placed This Encounter  Procedures  . Comp Met (CMET)    Standing Status:   Future    Standing Expiration Date:   08/10/2020  . Lipid panel    Standing Status:   Future    Standing Expiration Date:   08/10/2020  . Hemoglobin A1c    Standing Status:   Future    Standing Expiration Date:   08/10/2020  . PSA, Medicare ( [place] only)    Standing Status:   Future    Standing Expiration Date:   08/10/2020    No orders of the defined types were placed in this encounter.    I discussed the assessment and treatment plan with the patient. The patient was provided an opportunity to ask questions and all were answered. The patient agreed with the plan and demonstrated an understanding of the instructions.   The patient was advised to call back or seek an in-person evaluation if the symptoms worsen or if the condition fails to improve as anticipated.   I did discuss with the patient that Medicare does not pay for a CPE.  He noted that his secondary insurance would cover this if Medicare did not.  I offered him the option to make this a follow-up visit as there is the potential that it may not be covered as a physical exam by Medicare or has secondary [employer].  I also offered the option of having me check with our billing staff prior to doing the visit and getting him rescheduled at a future time once we know the final answer.  He opted to proceed with the visit today.  I checked with our billing team and they noted if the patient was sure that his secondary insurance would cover a CPE then to bill it as a  CPE.  Tommi Rumps, MD

## 2019-08-11 ENCOUNTER — Telehealth: Payer: Self-pay | Admitting: Family Medicine

## 2019-08-11 NOTE — Telephone Encounter (Signed)
I did not get this patient's follow-up put in the chart after his recent visit.  Can you get him scheduled for lab work sometime in the next several weeks?  Can you also check and see if he would like his second pneumonia vaccine and flu vaccine at the same time?  Can you also get him scheduled for follow-up in 6 months?  Thanks.

## 2019-08-11 NOTE — Assessment & Plan Note (Addendum)
Physical exam completed.  Plan for lab work in the next several weeks.  Encouraged continued exercise and discussed dietary changes.  We will check a PSA given his family history of prostate cancer.  Consider flu vaccine and pneumonia vaccine when he comes in for labs.  CMA will check with patient to make sure he would like to do these.  Lab work as outlined below.

## 2019-08-12 NOTE — Telephone Encounter (Signed)
I called and scheduled a lab appt and the patient will get the 2nd pneumonia and flu shot that day, I also scheduled a 6 month f/up with the provider in July.  Chinmay Squier,cma

## 2019-08-31 ENCOUNTER — Other Ambulatory Visit: Payer: Self-pay

## 2019-09-05 ENCOUNTER — Other Ambulatory Visit (INDEPENDENT_AMBULATORY_CARE_PROVIDER_SITE_OTHER): Payer: Medicare Other

## 2019-09-05 ENCOUNTER — Other Ambulatory Visit: Payer: Self-pay

## 2019-09-05 DIAGNOSIS — Z125 Encounter for screening for malignant neoplasm of prostate: Secondary | ICD-10-CM

## 2019-09-05 DIAGNOSIS — Z23 Encounter for immunization: Secondary | ICD-10-CM | POA: Diagnosis not present

## 2019-09-05 DIAGNOSIS — R7303 Prediabetes: Secondary | ICD-10-CM | POA: Diagnosis not present

## 2019-09-05 DIAGNOSIS — E785 Hyperlipidemia, unspecified: Secondary | ICD-10-CM

## 2019-09-05 LAB — COMPREHENSIVE METABOLIC PANEL
ALT: 12 U/L (ref 0–53)
AST: 19 U/L (ref 0–37)
Albumin: 4.2 g/dL (ref 3.5–5.2)
Alkaline Phosphatase: 55 U/L (ref 39–117)
BUN: 16 mg/dL (ref 6–23)
CO2: 29 mEq/L (ref 19–32)
Calcium: 9.4 mg/dL (ref 8.4–10.5)
Chloride: 104 mEq/L (ref 96–112)
Creatinine, Ser: 1.23 mg/dL (ref 0.40–1.50)
GFR: 70.53 mL/min (ref >60.00)
Glucose, Bld: 97 mg/dL (ref 70–99)
Potassium: 3.8 mEq/L (ref 3.5–5.1)
Sodium: 141 mEq/L (ref 135–145)
Total Bilirubin: 0.7 mg/dL (ref 0.2–1.2)
Total Protein: 6.9 g/dL (ref 6.0–8.3)

## 2019-09-05 LAB — LIPID PANEL
Cholesterol: 142 mg/dL (ref 0–200)
HDL: 52.6 mg/dL (ref >39.00)
LDL Cholesterol: 80 mg/dL (ref 0–99)
NonHDL: 89.22
Total CHOL/HDL Ratio: 3
Triglycerides: 47 mg/dL (ref 0.0–149.0)
VLDL: 9.4 mg/dL (ref 0.0–40.0)

## 2019-09-05 LAB — HEMOGLOBIN A1C: Hgb A1c MFr Bld: 5.8 % (ref 4.6–6.5)

## 2019-09-05 LAB — PSA, MEDICARE: PSA: 15.86 ng/ml — ABNORMAL HIGH (ref 0.10–4.00)

## 2019-09-05 NOTE — Addendum Note (Signed)
Addended by: Fulton Mole D on: 09/05/2019 10:43 AM   Modules accepted: Orders

## 2019-09-07 DIAGNOSIS — H40003 Preglaucoma, unspecified, bilateral: Secondary | ICD-10-CM | POA: Diagnosis not present

## 2019-09-15 ENCOUNTER — Other Ambulatory Visit: Payer: Self-pay | Admitting: Family Medicine

## 2019-09-15 DIAGNOSIS — R972 Elevated prostate specific antigen [PSA]: Secondary | ICD-10-CM

## 2019-09-24 ENCOUNTER — Ambulatory Visit: Payer: Medicare Other | Attending: Internal Medicine

## 2019-09-24 DIAGNOSIS — Z23 Encounter for immunization: Secondary | ICD-10-CM | POA: Insufficient documentation

## 2019-09-24 NOTE — Progress Notes (Signed)
   Covid-19 Vaccination Clinic  Name:  Zadian Perkey    MRN: [id number] DOB: 02-Dec-1950  09/24/2019  Mr. Bertino was observed post Covid-19 immunization for 15 minutes without incidence. He was provided with Vaccine Information Sheet and instruction to access the V-Safe system.   Mr. Brouse was instructed to call 911 with any severe reactions post vaccine: [place] Difficulty breathing  . Swelling of your face and throat  . A fast heartbeat  . A bad rash all over your body  . Dizziness and weakness    Immunizations Administered    Name Date Dose VIS Date Route   Pfizer COVID-19 Vaccine 09/24/2019 11:55 AM 0.3 mL 07/15/2019 Intramuscular   Manufacturer: [place]   Lot: [id number]   [place]: [id number]

## 2019-10-01 ENCOUNTER — Other Ambulatory Visit: Payer: Self-pay | Admitting: Family Medicine

## 2019-10-11 ENCOUNTER — Telehealth: Payer: Self-pay | Admitting: Family Medicine

## 2019-10-11 NOTE — Telephone Encounter (Signed)
Left message with pt spouse for patient to call back and schedule Medicare Annual Wellness Visit (AWV) either virtually or audio only.  Last AWV 6.19.18; please schedule at anytime with Denisa O'Brien-Blaney at [facility].

## 2019-10-19 ENCOUNTER — Ambulatory Visit: Payer: Medicare Other | Attending: Internal Medicine

## 2019-10-19 DIAGNOSIS — Z23 Encounter for immunization: Secondary | ICD-10-CM

## 2019-10-19 NOTE — Progress Notes (Signed)
   Covid-19 Vaccination Clinic  Name:  Jason Hayes    MRN: [id number] DOB: 17-Aug-1950  10/19/2019  Jason Hayes was observed post Covid-19 immunization for 15 minutes without incident. He was provided with Vaccine Information Sheet and instruction to access the V-Safe system.   Jason Hayes was instructed to call 911 with any severe reactions post vaccine: [place] Kitchen Difficulty breathing  . Swelling of face and throat  . A fast heartbeat  . A bad rash all over body  . Dizziness and weakness   Immunizations Administered    Name Date Dose VIS Date Route   Pfizer COVID-19 Vaccine 10/19/2019  8:21 AM 0.3 mL 07/15/2019 Intramuscular   Manufacturer: [place]   Lot: [id number]   [place]: [id number]

## 2019-10-21 ENCOUNTER — Ambulatory Visit: Payer: Medicare Other | Admitting: Urology

## 2019-12-21 ENCOUNTER — Other Ambulatory Visit: Payer: Self-pay

## 2019-12-21 ENCOUNTER — Ambulatory Visit: Payer: Medicare Other | Admitting: Urology

## 2019-12-21 ENCOUNTER — Ambulatory Visit (INDEPENDENT_AMBULATORY_CARE_PROVIDER_SITE_OTHER): Payer: Medicare Other | Admitting: Urology

## 2019-12-21 ENCOUNTER — Encounter: Payer: Self-pay | Admitting: Urology

## 2019-12-21 VITALS — BP 117/68 | HR 68 | Ht 71.0 in | Wt 195.0 lb

## 2019-12-21 DIAGNOSIS — R972 Elevated prostate specific antigen [PSA]: Secondary | ICD-10-CM

## 2019-12-21 DIAGNOSIS — N400 Enlarged prostate without lower urinary tract symptoms: Secondary | ICD-10-CM

## 2019-12-21 NOTE — Progress Notes (Signed)
12/20/19 8:21 AM   Arie Sabina 05-11-1951 [id number]  Referring provider: Leone Haven, MD [address] [place],  [place] [address] Chief Complaint  Patient presents with  . Follow-up    Elevated PSA    Jason Hayes is a 69 y.o. male seen at the request of Dr. Caryl Bis for evaluation of an elevated PSA.    -Was referred 09/2022 PSA 29.6 -PSA 09/05/2019 was at 15.86 -Long history of an elevated PSA with at least 3 - biopsies and PSA as high as 150 -Repeat PSA 09/2018 was 4.0 -He brought in several PSA results dating back to 2001.  PSA was < 1 2001  It had increased to 83-100 in 2003 and was up to 132 in 2005 - No significant change in urinary symptoms - Denies hematuria  PSA trend:    PMH: Past Medical History:  Diagnosis Date  . Asthma   . Colon polyps   . Hyperlipidemia   . Kidney stones     Surgical History: Past Surgical History:  Procedure Laterality Date  . COLONOSCOPY WITH PROPOFOL N/A 08/23/2018   Procedure: COLONOSCOPY WITH PROPOFOL;  Surgeon: Jonathon Bellows, MD;  Location: [facility] ENDOSCOPY;  Service: Gastroenterology;  Laterality: N/A;  . EYE SURGERY    . FOOT SURGERY    . HERNIA REPAIR    . KNEE SURGERY      Home Medications:  Allergies as of 12/21/2019   No Known Allergies     Medication List       Accurate as of Dec 21, 2019 11:59 PM. If you have any questions, ask your nurse or doctor.        atorvastatin 20 MG tablet Commonly known as: LIPITOR TAKE 1 TABLET AT BEDTIME       Allergies: No Known Allergies  Family History: Family History  Problem Relation Age of Onset  . Hyperlipidemia Mother   . Hypertension Mother   . Diabetes Mother   . Heart disease Mother   . Hypertension Father   . Hyperlipidemia Father   . Diabetes Father   . Heart disease Father   . Hyperlipidemia Sister   . Hypertension Sister   . Diabetes Sister   . Cancer Sister   . Ovarian cancer Sister   . Hypertension Brother   .  Hyperlipidemia Brother   . Diabetes Brother   . Prostate cancer Brother   . Prostate cancer Brother   . Stroke Sister     Social History:  reports that he has never smoked. He has never used smokeless tobacco. He reports that he does not drink alcohol or use drugs.   Physical Exam: BP 117/68   Pulse 68   Ht 5\' 11"  (1.803 m)   Wt 195 lb (88.5 kg)   BMI 27.20 kg/m   Constitutional:  Alert and oriented, No acute distress. HEENT: [place] AT, moist mucus membranes.  Trachea midline, no masses. Cardiovascular: No clubbing, cyanosis, or edema. Respiratory: Normal respiratory effort, no increased work of breathing. GU: Prostate 60+ grams, smooth without nodules Skin: No rashes, bruises or suspicious lesions. Neurologic: Grossly intact, no focal deficits, moving all 4 extremities. Psychiatric: Normal mood and affect.    Assessment & Plan:    1. Elevated PSA -Long history of elevated PSA with levels greater than 100 with prior negative biopsies. -Benign DRE -Last PSA February 2021 and will repeat today -If it remains elevated will proceed with prostate MRI. -If returns to normal follow-up with me 1 year  [place] [address], [place], [place] [address] [phone]  I, Joneen Boers Peace, am acting as a Education administrator for Dr. Nicki Reaper C. Latrel Szymczak.  I have reviewed the above documentation for accuracy and completeness, and I agree with the above.   Abbie Sons, MD

## 2019-12-22 ENCOUNTER — Telehealth: Payer: Self-pay | Admitting: *Deleted

## 2019-12-22 LAB — PSA: Prostate Specific Ag, Serum: 4 ng/mL (ref 0.0–4.0)

## 2019-12-22 NOTE — Telephone Encounter (Signed)
Left message on home number per [facility]

## 2019-12-22 NOTE — Telephone Encounter (Signed)
-----   Message from Abbie Sons, MD sent at 12/22/2019  7:07 AM EDT ----- Repeat PSA normal at 4.0.

## 2019-12-23 ENCOUNTER — Encounter: Payer: Self-pay | Admitting: Urology

## 2019-12-23 DIAGNOSIS — R972 Elevated prostate specific antigen [PSA]: Secondary | ICD-10-CM | POA: Insufficient documentation

## 2020-02-10 ENCOUNTER — Ambulatory Visit: Payer: Medicare Other | Admitting: Family Medicine

## 2020-03-09 ENCOUNTER — Other Ambulatory Visit: Payer: Self-pay | Admitting: Family Medicine

## 2020-07-09 ENCOUNTER — Ambulatory Visit: Payer: Medicare Other

## 2020-07-16 ENCOUNTER — Ambulatory Visit: Payer: Medicare Other

## 2020-09-07 DIAGNOSIS — H2511 Age-related nuclear cataract, right eye: Secondary | ICD-10-CM | POA: Diagnosis not present

## 2020-09-18 ENCOUNTER — Other Ambulatory Visit: Payer: Self-pay | Admitting: Family Medicine

## 2020-10-08 DIAGNOSIS — H2511 Age-related nuclear cataract, right eye: Secondary | ICD-10-CM | POA: Diagnosis not present

## 2020-10-08 DIAGNOSIS — E7800 Pure hypercholesterolemia, unspecified: Secondary | ICD-10-CM | POA: Diagnosis not present

## 2020-10-10 ENCOUNTER — Other Ambulatory Visit: Payer: Self-pay

## 2020-10-10 ENCOUNTER — Encounter: Payer: Self-pay | Admitting: Ophthalmology

## 2020-10-11 NOTE — Discharge Instructions (Signed)

## 2020-10-12 ENCOUNTER — Other Ambulatory Visit: Payer: Self-pay

## 2020-10-12 ENCOUNTER — Other Ambulatory Visit
Admission: RE | Admit: 2020-10-12 | Discharge: 2020-10-12 | Disposition: A | Discharge status: Home / Self Care | Payer: Medicare Other | Source: Ambulatory Visit | Attending: Ophthalmology | Admitting: Ophthalmology

## 2020-10-12 DIAGNOSIS — Z01812 Encounter for preprocedural laboratory examination: Secondary | ICD-10-CM | POA: Diagnosis not present

## 2020-10-12 DIAGNOSIS — Z20822 Contact with and (suspected) exposure to covid-19: Secondary | ICD-10-CM | POA: Diagnosis not present

## 2020-10-12 LAB — SARS CORONAVIRUS 2 (TAT 6-24 HRS): SARS Coronavirus 2: NEGATIVE

## 2020-10-16 ENCOUNTER — Ambulatory Visit: Payer: Medicare Other | Admitting: Anesthesiology

## 2020-10-16 ENCOUNTER — Other Ambulatory Visit: Payer: Self-pay

## 2020-10-16 ENCOUNTER — Encounter: Payer: Self-pay | Admitting: Ophthalmology

## 2020-10-16 ENCOUNTER — Ambulatory Visit
Admission: RE | Admit: 2020-10-16 | Discharge: 2020-10-16 | Disposition: A | Discharge status: Home / Self Care | Payer: Medicare Other | Attending: Ophthalmology | Admitting: Ophthalmology

## 2020-10-16 ENCOUNTER — Encounter
Admission: RE | Disposition: A | Discharge status: Home / Self Care | Payer: Self-pay | Source: Home / Self Care | Attending: Ophthalmology

## 2020-10-16 DIAGNOSIS — Z955 Presence of coronary angioplasty implant and graft: Secondary | ICD-10-CM | POA: Insufficient documentation

## 2020-10-16 DIAGNOSIS — Z79899 Other long term (current) drug therapy: Secondary | ICD-10-CM | POA: Insufficient documentation

## 2020-10-16 DIAGNOSIS — H2511 Age-related nuclear cataract, right eye: Secondary | ICD-10-CM | POA: Diagnosis not present

## 2020-10-16 DIAGNOSIS — H25811 Combined forms of age-related cataract, right eye: Secondary | ICD-10-CM | POA: Diagnosis not present

## 2020-10-16 HISTORY — PX: CATARACT EXTRACTION W/PHACO: SHX586

## 2020-10-16 SURGERY — PHACOEMULSIFICATION, CATARACT, WITH IOL INSERTION
Anesthesia: Monitor Anesthesia Care | Site: Eye | Laterality: Right

## 2020-10-16 MED ORDER — ACETAMINOPHEN 325 MG PO TABS
325.0000 mg | ORAL_TABLET | ORAL | Status: DC | PRN
Start: 2020-10-16 — End: 2020-10-16

## 2020-10-16 MED ORDER — MOXIFLOXACIN HCL 0.5 % OP SOLN
OPHTHALMIC | Status: DC | PRN
  Administered 2020-10-16: 0.2 mL via OPHTHALMIC

## 2020-10-16 MED ORDER — ACETAMINOPHEN 160 MG/5ML PO SOLN: 325.0000 mg | ORAL | Status: DC | PRN

## 2020-10-16 MED ORDER — NA CHONDROIT SULF-NA HYALURON 40-17 MG/ML IO SOLN
INTRAOCULAR | Status: DC | PRN
  Administered 2020-10-16: 1 mL via INTRAOCULAR

## 2020-10-16 MED ORDER — ONDANSETRON HCL 4 MG/2ML IJ SOLN: 4.0000 mg | Freq: Once | INTRAMUSCULAR | Status: DC | PRN

## 2020-10-16 MED ORDER — FENTANYL CITRATE (PF) 100 MCG/2ML IJ SOLN
INTRAMUSCULAR | Status: DC | PRN
  Administered 2020-10-16: 50 ug via INTRAVENOUS

## 2020-10-16 MED ORDER — BRIMONIDINE TARTRATE-TIMOLOL 0.2-0.5 % OP SOLN
OPHTHALMIC | Status: DC | PRN
  Administered 2020-10-16: 1 [drp] via OPHTHALMIC

## 2020-10-16 MED ORDER — TETRACAINE HCL 0.5 % OP SOLN
1.0000 [drp] | OPHTHALMIC | Status: DC | PRN
  Administered 2020-10-16 (×3): 1 [drp] via OPHTHALMIC

## 2020-10-16 MED ORDER — LIDOCAINE HCL (PF) 2 % IJ SOLN
INTRAOCULAR | Status: DC | PRN
  Administered 2020-10-16: 2 mL

## 2020-10-16 MED ORDER — EPINEPHRINE PF 1 MG/ML IJ SOLN
INTRAOCULAR | Status: DC | PRN
  Administered 2020-10-16: 51 mL via OPHTHALMIC

## 2020-10-16 MED ORDER — MIDAZOLAM HCL 2 MG/2ML IJ SOLN
INTRAMUSCULAR | Status: DC | PRN
  Administered 2020-10-16 (×2): 1 mg via INTRAVENOUS

## 2020-10-16 MED ORDER — ARMC OPHTHALMIC DILATING DROPS
1.0000 "application " | OPHTHALMIC | Status: DC | PRN
  Administered 2020-10-16 (×3): 1 via OPHTHALMIC

## 2020-10-16 SURGICAL SUPPLY — 17 items
CANNULA ANT/CHMB 27G (MISCELLANEOUS) ×2 IMPLANT
CANNULA ANT/CHMB 27GA (MISCELLANEOUS) ×4 IMPLANT
GLOVE SURG TRIUMPH 8.0 PF LTX (GLOVE) ×3 IMPLANT
GOWN STRL REUS W/ TWL LRG LVL3 (GOWN DISPOSABLE) ×2 IMPLANT
GOWN STRL REUS W/TWL LRG LVL3 (GOWN DISPOSABLE) ×4
LENS IOL TECNIS EYHANCE 22.0 (Intraocular Lens) ×1 IMPLANT
MARKER SKIN DUAL TIP RULER LAB (MISCELLANEOUS) ×2 IMPLANT
NDL FILTER BLUNT 18X1 1/2 (NEEDLE) ×1 IMPLANT
NEEDLE FILTER BLUNT 18X 1/2SAF (NEEDLE) ×1
NEEDLE FILTER BLUNT 18X1 1/2 (NEEDLE) ×1 IMPLANT
PACK EYE AFTER SURG (MISCELLANEOUS) ×2 IMPLANT
PACK OPTHALMIC (MISCELLANEOUS) ×2 IMPLANT
PACK PORFILIO (MISCELLANEOUS) ×2 IMPLANT
SYR 3ML LL SCALE MARK (SYRINGE) ×2 IMPLANT
SYR TB 1ML LUER SLIP (SYRINGE) ×2 IMPLANT
WATER STERILE IRR 250ML POUR (IV SOLUTION) ×2 IMPLANT
WIPE NON LINTING 3.25X3.25 (MISCELLANEOUS) ×2 IMPLANT

## 2020-10-16 NOTE — Anesthesia Procedure Notes (Signed)
Procedure Name: MAC Date/Time: 10/16/2020 8:23 AM Performed by: Dionne Bucy, CRNA Pre-anesthesia Checklist: Patient identified, Emergency Drugs available, Suction available, Patient being monitored and Timeout performed Patient Re-evaluated:Patient Re-evaluated prior to induction Oxygen Delivery Method: Nasal cannula Placement Confirmation: positive ETCO2

## 2020-10-16 NOTE — H&P (Signed)
[facility]   Primary Care Physician:  Leone Haven, MD Ophthalmologist: Dr. George Ina  Pre-Procedure History & Physical: HPI:  Jason Hayes is a 70 y.o. male here for cataract surgery.   Past Medical History:  Diagnosis Date  . Asthma    childhood  . Colon polyps   . Hyperlipidemia   . Kidney stones     Past Surgical History:  Procedure Laterality Date  . COLONOSCOPY WITH PROPOFOL N/A 08/23/2018   Procedure: COLONOSCOPY WITH PROPOFOL;  Surgeon: Jonathon Bellows, MD;  Location: [facility] ENDOSCOPY;  Service: Gastroenterology;  Laterality: N/A;  . EYE SURGERY    . FOOT SURGERY    . HERNIA REPAIR    . KNEE SURGERY      Prior to Admission medications   Medication Sig Start Date End Date Taking? Authorizing Provider  atorvastatin (LIPITOR) 20 MG tablet TAKE 1 TABLET AT BEDTIME 09/18/20  Yes Leone Haven, MD    Allergies as of 09/11/2020  . (No Known Allergies)    Family History  Problem Relation Age of Onset  . Hyperlipidemia Mother   . Hypertension Mother   . Diabetes Mother   . Heart disease Mother   . Hypertension Father   . Hyperlipidemia Father   . Diabetes Father   . Heart disease Father   . Hyperlipidemia Sister   . Hypertension Sister   . Diabetes Sister   . Cancer Sister   . Ovarian cancer Sister   . Hypertension Brother   . Hyperlipidemia Brother   . Diabetes Brother   . Prostate cancer Brother   . Prostate cancer Brother   . Stroke Sister     Social History   Socioeconomic History  . Marital status: Married    Spouse name: Not on file  . Number of children: Not on file  . Years of education: Not on file  . Highest education level: Not on file  Occupational History  . Not on file  Tobacco Use  . Smoking status: Never Smoker  . Smokeless tobacco: Never Used  Vaping Use  . Vaping Use: Never used  Substance and Sexual Activity  . Alcohol use: No    Alcohol/week: 0.0 standard drinks  . Drug use: No  . Sexual activity: Yes     Partners: Female  Other Topics Concern  . Not on file  Social History Narrative  . Not on file   Social Determinants of Health   Financial Resource Strain: Not on file  Food Insecurity: Not on file  Transportation Needs: Not on file  Physical Activity: Not on file  Stress: Not on file  Social Connections: Not on file  Intimate Partner Violence: Not on file    Review of Systems: See HPI, otherwise negative ROS  Physical Exam: BP 136/75   Pulse 60   Temp 98.4 F (36.9 C) (Temporal)   Resp 18   Ht 5\' 11"  (1.803 m)   Wt 88.5 kg   SpO2 100%   BMI 27.21 kg/m  General:   Alert,  pleasant and cooperative in NAD Head:  Normocephalic and atraumatic. Respiratory:  Normal work of breathing.  Impression/Plan: Jason Hayes is here for cataract surgery.  Risks, benefits, limitations, and alternatives regarding cataract surgery have been reviewed with the patient.  Questions have been answered.  All parties agreeable.   Birder Robson, MD  10/16/2020, 8:15 AM

## 2020-10-16 NOTE — Op Note (Signed)
PREOPERATIVE DIAGNOSIS:  Nuclear sclerotic cataract of the right eye.   POSTOPERATIVE DIAGNOSIS:  Cataract   OPERATIVE PROCEDURE:@   SURGEON:  Birder Robson, MD.   ANESTHESIA:  Anesthesiologist: Veda Canning, MD CRNA: Dionne Bucy, CRNA  1.      Managed anesthesia care. 2.      0.84ml of Shugarcaine was instilled in the eye following the paracentesis.   COMPLICATIONS:  None.   TECHNIQUE:   Stop and chop   DESCRIPTION OF PROCEDURE:  The patient was examined and consented in the preoperative holding area where the aforementioned topical anesthesia was applied to the right eye and then brought back to the Operating Room where the right eye was prepped and draped in the usual sterile ophthalmic fashion and a lid speculum was placed. A paracentesis was created with the side port blade and the anterior chamber was filled with viscoelastic. A near clear corneal incision was performed with the steel keratome. A continuous curvilinear capsulorrhexis was performed with a cystotome followed by the capsulorrhexis forceps. Hydrodissection and hydrodelineation were carried out with BSS on a blunt cannula. The lens was removed in a stop and chop  technique and the remaining cortical material was removed with the irrigation-aspiration handpiece. The capsular bag was inflated with viscoelastic and the Technis ZCB00  lens was placed in the capsular bag without complication. The remaining viscoelastic was removed from the eye with the irrigation-aspiration handpiece. The wounds were hydrated. The anterior chamber was flushed with BSS and the eye was inflated to physiologic pressure. 0.52ml of Vigamox was placed in the anterior chamber. The wounds were found to be water tight. The eye was dressed with Combigan. The patient was given protective glasses to wear throughout the day and a shield with which to sleep tonight. The patient was also given drops with which to begin a drop regimen today and will follow-up  with me in one day. Implant Name Type Inv. Item Serial No. Manufacturer Lot No. LRB No. Used Action  LENS IOL TECNIS EYHANCE 22.0 - [id number] Intraocular Lens LENS IOL TECNIS EYHANCE 22.0 [id number] JOHNSON   Right 1 Implanted   Procedure(s): CATARACT EXTRACTION PHACO AND INTRAOCULAR LENS PLACEMENT (IOC) RIGHT 5.67 00:36.1 (Right)  Electronically signed: Birder Robson 10/16/2020 8:41 AM

## 2020-10-16 NOTE — Anesthesia Postprocedure Evaluation (Signed)
Anesthesia Post Note  Patient: Jason Hayes  Procedure(s) Performed: CATARACT EXTRACTION PHACO AND INTRAOCULAR LENS PLACEMENT (IOC) RIGHT 5.67 00:36.1 (Right Eye)     Patient location during evaluation: PACU Anesthesia Type: MAC Level of consciousness: awake Pain management: pain level controlled Vital Signs Assessment: post-procedure vital signs reviewed and stable Respiratory status: respiratory function stable Cardiovascular status: stable Postop Assessment: no apparent nausea or vomiting Anesthetic complications: no   No complications documented.  Veda Canning

## 2020-10-16 NOTE — Anesthesia Preprocedure Evaluation (Signed)
Anesthesia Evaluation  Patient identified by MRN, date of birth, ID band Patient awake    Reviewed: Allergy & Precautions, NPO status   Airway Mallampati: II  TM Distance: >3 FB     Dental   Pulmonary asthma ,    breath sounds clear to auscultation       Cardiovascular  Rhythm:Regular Rate:Normal  HLD   Neuro/Psych    GI/Hepatic   Endo/Other    Renal/GU      Musculoskeletal   Abdominal   Peds  Hematology   Anesthesia Other Findings   Reproductive/Obstetrics                             Anesthesia Physical  Anesthesia Plan  ASA: II  Anesthesia Plan: MAC   Post-op Pain Management:    Induction: Intravenous  PONV Risk Score and Plan: TIVA, Midazolam and Treatment may vary due to age or medical condition  Airway Management Planned: Natural Airway and Nasal Cannula  Additional Equipment:   Intra-op Plan:   Post-operative Plan:   Informed Consent: I have reviewed the patients History and Physical, chart, labs and discussed the procedure including the risks, benefits and alternatives for the proposed anesthesia with the patient or authorized representative who has indicated his/her understanding and acceptance.       Plan Discussed with: CRNA  Anesthesia Plan Comments:         Anesthesia Quick Evaluation  

## 2020-10-16 NOTE — Transfer of Care (Signed)
Immediate Anesthesia Transfer of Care Note  Patient: Jason Hayes  Procedure(s) Performed: CATARACT EXTRACTION PHACO AND INTRAOCULAR LENS PLACEMENT (IOC) RIGHT 5.67 00:36.1 (Right Eye)  Patient Location: PACU  Anesthesia Type: MAC  Level of Consciousness: awake, alert  and patient cooperative  Airway and Oxygen Therapy: Patient Spontanous Breathing and Patient connected to supplemental oxygen  Post-op Assessment: Post-op Vital signs reviewed, Patient's Cardiovascular Status Stable, Respiratory Function Stable, Patent Airway and No signs of Nausea or vomiting  Post-op Vital Signs: Reviewed and stable  Complications: No complications documented.

## 2020-10-17 ENCOUNTER — Encounter: Payer: Self-pay | Admitting: Ophthalmology

## 2020-10-22 ENCOUNTER — Encounter: Payer: Self-pay | Admitting: Ophthalmology

## 2020-10-24 DIAGNOSIS — H2512 Age-related nuclear cataract, left eye: Secondary | ICD-10-CM | POA: Diagnosis not present

## 2020-10-26 ENCOUNTER — Other Ambulatory Visit: Payer: Self-pay

## 2020-10-26 ENCOUNTER — Other Ambulatory Visit
Admission: RE | Admit: 2020-10-26 | Discharge: 2020-10-26 | Disposition: A | Discharge status: Home / Self Care | Payer: Medicare Other | Source: Ambulatory Visit | Attending: Ophthalmology | Admitting: Ophthalmology

## 2020-10-26 DIAGNOSIS — Z01812 Encounter for preprocedural laboratory examination: Secondary | ICD-10-CM | POA: Insufficient documentation

## 2020-10-26 DIAGNOSIS — Z20822 Contact with and (suspected) exposure to covid-19: Secondary | ICD-10-CM | POA: Diagnosis not present

## 2020-10-26 NOTE — Discharge Instructions (Signed)

## 2020-10-27 LAB — SARS CORONAVIRUS 2 (TAT 6-24 HRS): SARS Coronavirus 2: NEGATIVE

## 2020-10-30 ENCOUNTER — Encounter: Payer: Self-pay | Admitting: Ophthalmology

## 2020-10-30 ENCOUNTER — Ambulatory Visit: Payer: Medicare Other | Admitting: Anesthesiology

## 2020-10-30 ENCOUNTER — Other Ambulatory Visit: Payer: Self-pay

## 2020-10-30 ENCOUNTER — Encounter
Admission: RE | Disposition: A | Discharge status: Home / Self Care | Payer: Self-pay | Source: Home / Self Care | Attending: Ophthalmology

## 2020-10-30 ENCOUNTER — Ambulatory Visit
Admission: RE | Admit: 2020-10-30 | Discharge: 2020-10-30 | Disposition: A | Discharge status: Home / Self Care | Payer: Medicare Other | Attending: Ophthalmology | Admitting: Ophthalmology

## 2020-10-30 DIAGNOSIS — Z79899 Other long term (current) drug therapy: Secondary | ICD-10-CM | POA: Diagnosis not present

## 2020-10-30 DIAGNOSIS — E785 Hyperlipidemia, unspecified: Secondary | ICD-10-CM | POA: Diagnosis not present

## 2020-10-30 DIAGNOSIS — H25812 Combined forms of age-related cataract, left eye: Secondary | ICD-10-CM | POA: Diagnosis not present

## 2020-10-30 DIAGNOSIS — H2512 Age-related nuclear cataract, left eye: Secondary | ICD-10-CM | POA: Diagnosis not present

## 2020-10-30 HISTORY — PX: CATARACT EXTRACTION W/PHACO: SHX586

## 2020-10-30 SURGERY — PHACOEMULSIFICATION, CATARACT, WITH IOL INSERTION
Anesthesia: Monitor Anesthesia Care | Site: Eye | Laterality: Left

## 2020-10-30 MED ORDER — MIDAZOLAM HCL 2 MG/2ML IJ SOLN
INTRAMUSCULAR | Status: DC | PRN
  Administered 2020-10-30: 2 mg via INTRAVENOUS

## 2020-10-30 MED ORDER — FENTANYL CITRATE (PF) 100 MCG/2ML IJ SOLN
INTRAMUSCULAR | Status: DC | PRN
  Administered 2020-10-30 (×2): 50 ug via INTRAVENOUS

## 2020-10-30 MED ORDER — BRIMONIDINE TARTRATE-TIMOLOL 0.2-0.5 % OP SOLN
OPHTHALMIC | Status: DC | PRN
  Administered 2020-10-30: 1 [drp] via OPHTHALMIC

## 2020-10-30 MED ORDER — TETRACAINE HCL 0.5 % OP SOLN
1.0000 [drp] | OPHTHALMIC | Status: DC | PRN
  Administered 2020-10-30 (×3): 1 [drp] via OPHTHALMIC

## 2020-10-30 MED ORDER — NA CHONDROIT SULF-NA HYALURON 40-17 MG/ML IO SOLN
INTRAOCULAR | Status: DC | PRN
  Administered 2020-10-30: 1 mL via INTRAOCULAR

## 2020-10-30 MED ORDER — MOXIFLOXACIN HCL 0.5 % OP SOLN
OPHTHALMIC | Status: DC | PRN
  Administered 2020-10-30: 0.2 mL via OPHTHALMIC

## 2020-10-30 MED ORDER — ARMC OPHTHALMIC DILATING DROPS
1.0000 "application " | OPHTHALMIC | Status: DC | PRN
  Administered 2020-10-30 (×3): 1 via OPHTHALMIC

## 2020-10-30 MED ORDER — LIDOCAINE HCL (PF) 2 % IJ SOLN
INTRAOCULAR | Status: DC | PRN
  Administered 2020-10-30: .5 mL

## 2020-10-30 MED ORDER — EPINEPHRINE PF 1 MG/ML IJ SOLN
INTRAOCULAR | Status: DC | PRN
  Administered 2020-10-30: 50 mL via OPHTHALMIC

## 2020-10-30 SURGICAL SUPPLY — 19 items
CANNULA ANT/CHMB 27G (MISCELLANEOUS) ×2 IMPLANT
CANNULA ANT/CHMB 27GA (MISCELLANEOUS) ×4 IMPLANT
GLOVE SURG TRIUMPH 8.0 PF LTX (GLOVE) ×4 IMPLANT
GOWN STRL REUS W/ TWL LRG LVL3 (GOWN DISPOSABLE) ×2 IMPLANT
GOWN STRL REUS W/TWL LRG LVL3 (GOWN DISPOSABLE) ×4
LENS IOL TECNIS EYHANCE 23.5 (Intraocular Lens) ×1 IMPLANT
MARKER SKIN DUAL TIP RULER LAB (MISCELLANEOUS) ×2 IMPLANT
NDL FILTER BLUNT 18X1 1/2 (NEEDLE) ×1 IMPLANT
NEEDLE FILTER BLUNT 18X 1/2SAF (NEEDLE) ×1
NEEDLE FILTER BLUNT 18X1 1/2 (NEEDLE) ×1 IMPLANT
PACK EYE AFTER SURG (MISCELLANEOUS) ×2 IMPLANT
PACK OPTHALMIC (MISCELLANEOUS) ×2 IMPLANT
PACK PORFILIO (MISCELLANEOUS) ×2 IMPLANT
SUT ETHILON 10-0 CS-B-6CS-B-6 (SUTURE) ×2
SUTURE EHLN 10-0 CS-B-6CS-B-6 (SUTURE) IMPLANT
SYR 3ML LL SCALE MARK (SYRINGE) ×2 IMPLANT
SYR TB 1ML LUER SLIP (SYRINGE) ×2 IMPLANT
WATER STERILE IRR 250ML POUR (IV SOLUTION) ×2 IMPLANT
WIPE NON LINTING 3.25X3.25 (MISCELLANEOUS) ×2 IMPLANT

## 2020-10-30 NOTE — H&P (Signed)
[facility]   Primary Care Physician:  Leone Haven, MD Ophthalmologist: Dr. George Ina  Pre-Procedure History & Physical: HPI:  Jason Hayes is a 70 y.o. male here for cataract surgery.   Past Medical History:  Diagnosis Date  . Asthma    childhood  . Colon polyps   . Hyperlipidemia   . Kidney stones     Past Surgical History:  Procedure Laterality Date  . CATARACT EXTRACTION W/PHACO Right 10/16/2020   Procedure: CATARACT EXTRACTION PHACO AND INTRAOCULAR LENS PLACEMENT (IOC) RIGHT 5.67 00:36.1;  Surgeon: Birder Robson, MD;  Location: [place];  Service: Ophthalmology;  Laterality: Right;  . COLONOSCOPY WITH PROPOFOL N/A 08/23/2018   Procedure: COLONOSCOPY WITH PROPOFOL;  Surgeon: Jonathon Bellows, MD;  Location: [facility] ENDOSCOPY;  Service: Gastroenterology;  Laterality: N/A;  . EYE SURGERY    . FOOT SURGERY    . HERNIA REPAIR    . KNEE SURGERY      Prior to Admission medications   Medication Sig Start Date End Date Taking? Authorizing Provider  atorvastatin (LIPITOR) 20 MG tablet TAKE 1 TABLET AT BEDTIME 09/18/20  Yes Leone Haven, MD    Allergies as of 09/11/2020  . (No Known Allergies)    Family History  Problem Relation Age of Onset  . Hyperlipidemia Mother   . Hypertension Mother   . Diabetes Mother   . Heart disease Mother   . Hypertension Father   . Hyperlipidemia Father   . Diabetes Father   . Heart disease Father   . Hyperlipidemia Sister   . Hypertension Sister   . Diabetes Sister   . Cancer Sister   . Ovarian cancer Sister   . Hypertension Brother   . Hyperlipidemia Brother   . Diabetes Brother   . Prostate cancer Brother   . Prostate cancer Brother   . Stroke Sister     Social History   Socioeconomic History  . Marital status: Married    Spouse name: Not on file  . Number of children: Not on file  . Years of education: Not on file  . Highest education level: Not on file  Occupational History  . Not on file   Tobacco Use  . Smoking status: Never Smoker  . Smokeless tobacco: Never Used  Vaping Use  . Vaping Use: Never used  Substance and Sexual Activity  . Alcohol use: No    Alcohol/week: 0.0 standard drinks  . Drug use: No  . Sexual activity: Yes    Partners: Female  Other Topics Concern  . Not on file  Social History Narrative  . Not on file   Social Determinants of Health   Financial Resource Strain: Not on file  Food Insecurity: Not on file  Transportation Needs: Not on file  Physical Activity: Not on file  Stress: Not on file  Social Connections: Not on file  Intimate Partner Violence: Not on file    Review of Systems: See HPI, otherwise negative ROS  Physical Exam: BP 138/79   Pulse (!) 51   Temp (!) 96.3 F (35.7 C) (Temporal)   Ht 5\' 11"  (1.803 m)   Wt 92.1 kg   SpO2 99%   BMI 28.31 kg/m  General:   Alert,  pleasant and cooperative in NAD Head:  Normocephalic and atraumatic. Respiratory:  Normal work of breathing. Cardiovascular:  RRR  Impression/Plan: Jason Hayes is here for cataract surgery.  Risks, benefits, limitations, and alternatives regarding cataract surgery have been reviewed with the patient.  Questions have been answered.  All parties agreeable.   Birder Robson, MD  10/30/2020, 10:20 AM

## 2020-10-30 NOTE — Op Note (Signed)
PREOPERATIVE DIAGNOSIS:  Nuclear sclerotic cataract of the left eye.   POSTOPERATIVE DIAGNOSIS:  Nuclear sclerotic cataract of the left eye.   OPERATIVE PROCEDURE:@   SURGEON:  Birder Robson, MD.   ANESTHESIA:  Anesthesiologist: Veda Canning, MD CRNA: Mayme Genta, CRNA  1.      Managed anesthesia care. 2.     0.26ml of Shugarcaine was instilled following the paracentesis   COMPLICATIONS:  2 interrupted 10-0 sutures were placed in one of the RK incisions which was weeping at the end of the case.   TECHNIQUE:   Stop and chop   DESCRIPTION OF PROCEDURE:  The patient was examined and consented in the preoperative holding area where the aforementioned topical anesthesia was applied to the left eye and then brought back to the Operating Room where the left eye was prepped and draped in the usual sterile ophthalmic fashion and a lid speculum was placed. A paracentesis was created with the side port blade and the anterior chamber was filled with viscoelastic. A near clear corneal incision was performed with the steel keratome. A continuous curvilinear capsulorrhexis was performed with a cystotome followed by the capsulorrhexis forceps. Hydrodissection and hydrodelineation were carried out with BSS on a blunt cannula. The lens was removed in a stop and chop  technique and the remaining cortical material was removed with the irrigation-aspiration handpiece. The capsular bag was inflated with viscoelastic and the Technis ZCB00 lens was placed in the capsular bag without complication. The remaining viscoelastic was removed from the eye with the irrigation-aspiration handpiece. The wounds were hydrated. The anterior chamber was flushed with BSS and the eye was inflated to physiologic pressure. 0.18ml Vigamox was placed in the anterior chamber. The wounds were found to be water tight. The eye was dressed with Combigan. The patient was given protective glasses to wear throughout the day and a shield with  which to sleep tonight. The patient was also given drops with which to begin a drop regimen today and will follow-up with me in one day. Implant Name Type Inv. Item Serial No. Manufacturer Lot No. LRB No. Used Action  LENS IOL TECNIS EYHANCE 23.5 - [id number] Intraocular Lens LENS IOL TECNIS EYHANCE 23.5 [id number] JOHNSON   Left 1 Implanted    Procedure(s) with comments: CATARACT EXTRACTION PHACO AND INTRAOCULAR LENS PLACEMENT (IOC) LEFT (Left) - 5.89 0:31.4  Electronically signed: Birder Robson 10/30/2020 10:53 AM

## 2020-10-30 NOTE — Anesthesia Procedure Notes (Signed)
Performed by: Phoenicia Pirie, CRNA Pre-anesthesia Checklist: Patient identified, Emergency Drugs available, Suction available, Timeout performed and Patient being monitored Patient Re-evaluated:Patient Re-evaluated prior to induction Oxygen Delivery Method: Nasal cannula Placement Confirmation: positive ETCO2       

## 2020-10-30 NOTE — Transfer of Care (Signed)
Immediate Anesthesia Transfer of Care Note  Patient: Jason Hayes  Procedure(s) Performed: CATARACT EXTRACTION PHACO AND INTRAOCULAR LENS PLACEMENT (IOC) LEFT (Left Eye)  Patient Location: PACU  Anesthesia Type: MAC  Level of Consciousness: awake, alert  and patient cooperative  Airway and Oxygen Therapy: Patient Spontanous Breathing and Patient connected to supplemental oxygen  Post-op Assessment: Post-op Vital signs reviewed, Patient's Cardiovascular Status Stable, Respiratory Function Stable, Patent Airway and No signs of Nausea or vomiting  Post-op Vital Signs: Reviewed and stable  Complications: No complications documented.

## 2020-10-30 NOTE — Anesthesia Postprocedure Evaluation (Signed)
Anesthesia Post Note  Patient: Jason Hayes  Procedure(s) Performed: CATARACT EXTRACTION PHACO AND INTRAOCULAR LENS PLACEMENT (IOC) LEFT (Left Eye)     Patient location during evaluation: PACU Anesthesia Type: MAC Level of consciousness: awake Pain management: pain level controlled Vital Signs Assessment: post-procedure vital signs reviewed and stable Respiratory status: respiratory function stable Cardiovascular status: stable Postop Assessment: no apparent nausea or vomiting Anesthetic complications: no   No complications documented.  Veda Canning

## 2020-10-30 NOTE — Anesthesia Preprocedure Evaluation (Signed)
Anesthesia Evaluation  Patient identified by MRN, date of birth, ID band Patient awake    Reviewed: Allergy & Precautions, NPO status   Airway Mallampati: II  TM Distance: >3 FB     Dental   Pulmonary asthma ,    breath sounds clear to auscultation       Cardiovascular  Rhythm:Regular Rate:Normal  HLD   Neuro/Psych    GI/Hepatic   Endo/Other    Renal/GU      Musculoskeletal   Abdominal   Peds  Hematology   Anesthesia Other Findings   Reproductive/Obstetrics                             Anesthesia Physical  Anesthesia Plan  ASA: II  Anesthesia Plan: MAC   Post-op Pain Management:    Induction: Intravenous  PONV Risk Score and Plan: TIVA, Midazolam and Treatment may vary due to age or medical condition  Airway Management Planned: Natural Airway and Nasal Cannula  Additional Equipment:   Intra-op Plan:   Post-operative Plan:   Informed Consent: I have reviewed the patients History and Physical, chart, labs and discussed the procedure including the risks, benefits and alternatives for the proposed anesthesia with the patient or authorized representative who has indicated his/her understanding and acceptance.       Plan Discussed with: CRNA  Anesthesia Plan Comments:         Anesthesia Quick Evaluation

## 2020-10-31 ENCOUNTER — Encounter: Payer: Self-pay | Admitting: Ophthalmology

## 2020-11-30 ENCOUNTER — Encounter: Payer: Self-pay | Admitting: Family Medicine

## 2020-11-30 ENCOUNTER — Ambulatory Visit (INDEPENDENT_AMBULATORY_CARE_PROVIDER_SITE_OTHER): Payer: Medicare Other | Admitting: Family Medicine

## 2020-11-30 ENCOUNTER — Other Ambulatory Visit: Payer: Self-pay

## 2020-11-30 VITALS — BP 110/70 | HR 59 | Temp 97.9°F | Ht 71.0 in | Wt 204.2 lb

## 2020-11-30 DIAGNOSIS — R7303 Prediabetes: Secondary | ICD-10-CM

## 2020-11-30 DIAGNOSIS — M79605 Pain in left leg: Secondary | ICD-10-CM | POA: Diagnosis not present

## 2020-11-30 DIAGNOSIS — E785 Hyperlipidemia, unspecified: Secondary | ICD-10-CM | POA: Diagnosis not present

## 2020-11-30 DIAGNOSIS — Z125 Encounter for screening for malignant neoplasm of prostate: Secondary | ICD-10-CM

## 2020-11-30 DIAGNOSIS — E663 Overweight: Secondary | ICD-10-CM | POA: Diagnosis not present

## 2020-11-30 MED ORDER — PREDNISONE 20 MG PO TABS: 40.0000 mg | ORAL_TABLET | Freq: Every day | ORAL | 0 refills | Status: DC

## 2020-11-30 MED ORDER — ATORVASTATIN CALCIUM 20 MG PO TABS: 1.0000 | ORAL_TABLET | Freq: Every day | ORAL | 1 refills | Status: DC

## 2020-11-30 NOTE — Assessment & Plan Note (Signed)
Possibly related to sciatica though could represent a muscular strain or less likely an arthritic issue.  Given the potential for sciatica and his ongoing symptoms we will treat with prednisone 40 mg once daily for 5 days.  Discussed risk of increased appetite, sleeping difficulty, and agitation with this medication.  If his pain is not improving with this medication he will let us know.

## 2020-11-30 NOTE — Assessment & Plan Note (Signed)
Continue Lipitor.  Refill provided.  Lab work to be completed.

## 2020-11-30 NOTE — Progress Notes (Signed)
Tommi Rumps, MD Phone: [phone]  Jason Hayes is a 70 y.o. male who presents today for f/u.  Left leg pain: Patient has this been going on for 2 to 3 months.  Typically bothers him most when he gets up from laying down or sitting.  Radiates from his left lateral buttock down the posterior aspect of his left leg.  Sometimes he gets down into his shin area.  He notes no low back pain.  No numbness.  Occasionally feels as though the leg is going to give out on him.  No incontinence.  He tries Advil with some benefit.  No history of this in the past.  Hyperlipidemia: Taking Lipitor.  No chest pain, claudication, right upper quadrant pain, or myalgias.  Overweight/prediabetes: Patient notes he eats pretty much everything.  He does eat lots of peanuts.  He does drink a fair amount of sweet tea.  Occasionally has cookies.  Does some exercise with body weight exercises and walking and running though this has been somewhat limited by his leg pain.  Social History   Tobacco Use  Smoking Status Never Smoker  Smokeless Tobacco Never Used    No current outpatient medications on file prior to visit.   No current facility-administered medications on file prior to visit.     ROS see history of present illness  Objective  Physical Exam Vitals:   11/30/20 0802  BP: 110/70  Pulse: (!) 59  Temp: 97.9 F (36.6 C)  SpO2: 99%    BP Readings from Last 3 Encounters:  11/30/20 110/70  10/30/20 126/81  10/16/20 123/79   Wt Readings from Last 3 Encounters:  11/30/20 204 lb 3.2 oz (92.6 kg)  10/30/20 203 lb (92.1 kg)  10/16/20 195 lb 1.7 oz (88.5 kg)    Physical Exam Constitutional:      General: He is not in acute distress.    Appearance: He is not diaphoretic.  Cardiovascular:     Rate and Rhythm: Normal rate and regular rhythm.     Heart sounds: Normal heart sounds.  Pulmonary:     Effort: Pulmonary effort is normal.     Breath sounds: Normal breath sounds.   Musculoskeletal:     Right lower leg: No edema.     Left lower leg: No edema.     Comments: No midline spine tenderness, no midline spine step-off, no muscular back tenderness, patient has good internal and external range of motion bilateral hips with no discomfort, negative straight leg raise bilaterally  Skin:    General: Skin is warm and dry.  Neurological:     Mental Status: He is alert.     Comments: 5/5 strength bilateral quads, hamstrings, plantar flexion, and dorsiflexion, sensation light touch intact bilateral lower extremities, 2+ patellar reflexes.      Assessment/Plan: Please see individual problem list.  Problem List Items Addressed This Visit    Hyperlipidemia    Continue Lipitor.  Refill provided.  Lab work to be completed.      Relevant Medications   atorvastatin (LIPITOR) 20 MG tablet   Other Relevant Orders   Lipid panel   Comp Met (CMET)   Prediabetes    Plan to check A1c.  Discussed diet and exercise.      Relevant Orders   HgB A1c   Overweight    I encouraged continued exercise.  Discussed decreasing sweet tea intake.  I advised him to monitor his intake of peanuts given that this is a calorie dense food.  Relevant Orders   HgB A1c   Left leg pain    Possibly related to sciatica though could represent a muscular strain or less likely an arthritic issue.  Given the potential for sciatica and his ongoing symptoms we will treat with prednisone 40 mg once daily for 5 days.  Discussed risk of increased appetite, sleeping difficulty, and agitation with this medication.  If his pain is not improving with this medication he will let us know.      Relevant Medications   predniSONE (DELTASONE) 20 MG tablet    Other Visit Diagnoses    Prostate cancer screening    -  Primary   Relevant Orders   PSA, Medicare ( [place] only)      The patient defers his labs until his physical which will be scheduled for 3-4 weeks.   This visit occurred during  the SARS-CoV-2 public health emergency.  Safety protocols were in place, including screening questions prior to the visit, additional usage of staff PPE, and extensive cleaning of exam room while observing appropriate contact time as indicated for disinfecting solutions.    Tommi Rumps, MD [place]

## 2020-11-30 NOTE — Assessment & Plan Note (Signed)
Plan to check A1c.  Discussed diet and exercise.

## 2020-11-30 NOTE — Patient Instructions (Signed)
Nice to see you. Please try the prednisone.  If this is not beneficial for your pain please let us know and we can get you set up for physical therapy. Please try to cut down on sweet tea and peanut intake. We will get your physical scheduled.

## 2020-11-30 NOTE — Assessment & Plan Note (Signed)
I encouraged continued exercise.  Discussed decreasing sweet tea intake.  I advised him to monitor his intake of peanuts given that this is a calorie dense food.

## 2020-12-27 ENCOUNTER — Other Ambulatory Visit: Payer: Self-pay

## 2021-01-04 ENCOUNTER — Ambulatory Visit (INDEPENDENT_AMBULATORY_CARE_PROVIDER_SITE_OTHER): Payer: Medicare Other | Admitting: Family Medicine

## 2021-01-04 ENCOUNTER — Other Ambulatory Visit: Payer: Self-pay

## 2021-01-04 ENCOUNTER — Encounter: Payer: Self-pay | Admitting: Family Medicine

## 2021-01-04 DIAGNOSIS — R208 Other disturbances of skin sensation: Secondary | ICD-10-CM | POA: Diagnosis not present

## 2021-01-04 DIAGNOSIS — R7303 Prediabetes: Secondary | ICD-10-CM

## 2021-01-04 DIAGNOSIS — L821 Other seborrheic keratosis: Secondary | ICD-10-CM | POA: Insufficient documentation

## 2021-01-04 DIAGNOSIS — Z0001 Encounter for general adult medical examination with abnormal findings: Secondary | ICD-10-CM

## 2021-01-04 DIAGNOSIS — E663 Overweight: Secondary | ICD-10-CM

## 2021-01-04 DIAGNOSIS — Z125 Encounter for screening for malignant neoplasm of prostate: Secondary | ICD-10-CM

## 2021-01-04 DIAGNOSIS — E785 Hyperlipidemia, unspecified: Secondary | ICD-10-CM

## 2021-01-04 DIAGNOSIS — R200 Anesthesia of skin: Secondary | ICD-10-CM

## 2021-01-04 LAB — PSA, MEDICARE: PSA: 7.76 ng/ml — ABNORMAL HIGH (ref 0.10–4.00)

## 2021-01-04 LAB — HEMOGLOBIN A1C: Hgb A1c MFr Bld: 5.9 % (ref 4.6–6.5)

## 2021-01-04 NOTE — Progress Notes (Signed)
Tommi Rumps, MD Phone: [phone]  Jason Hayes is a 70 y.o. male who presents today for CPE.  Diet: Generally healthy, typically eats lean meats, does get some vegetables and fruits Exercise: Does body weight exercises 3 days a week, also runs and walks, does lifting at work 2 days a week Colonoscopy: 08/23/2018 with 5-year recall Prostate cancer screening: Due Family history-  Prostate cancer: 2 brothers  Colon cancer: No Vaccines-   Flu: Out of season  Tetanus: Up-to-date  Shingles: The patient believes he received the shingles vaccine about 5 years ago  COVID19: Up-to-date with 2 boosters  Pneumonia: Up-to-date Hep C Screening: Up-to-date Tobacco use: No Alcohol use: No Illicit Drug use: No Dentist: Yes Ophthalmology: Yes  Left foot numbness: Patient notes the fibular side of his left foot occasionally gets numb when he is just been sitting there.  He notes it feels as though the foot is falling asleep distally near his lateral 3 toes.  He will get up and move around and that will resolve it.  Notes no symptoms if he is up moving around.  He has no other numbness.  No weakness.  No back pain though does have a history of sciatica.  Skin lesion: This is on his left temple.  Its been there for at least a year.  It has gotten slightly harder.  He thinks it has grown a Dalby bit.   Active Ambulatory Problems    Diagnosis Date Noted  . Hyperlipidemia 06/20/2015  . Encounter for general adult medical examination with abnormal findings 01/22/2016  . Prediabetes 12/18/2016  . BPH without obstruction/lower urinary tract symptoms 08/05/2017  . Overweight 08/15/2018  . Family history of prostate cancer 08/15/2018  . Elevated PSA 12/23/2019  . Left leg pain 11/30/2020  . Numbness of left foot 01/04/2021  . Seborrheic keratosis 01/04/2021   Resolved Ambulatory Problems    Diagnosis Date Noted  . Viral respiratory illness 08/05/2017   Past Medical History:  Diagnosis  Date  . Asthma   . Colon polyps   . Kidney stones     Family History  Problem Relation Age of Onset  . Hyperlipidemia Mother   . Hypertension Mother   . Diabetes Mother   . Heart disease Mother   . Hypertension Father   . Hyperlipidemia Father   . Diabetes Father   . Heart disease Father   . Hyperlipidemia Sister   . Hypertension Sister   . Diabetes Sister   . Cancer Sister   . Ovarian cancer Sister   . Hypertension Brother   . Hyperlipidemia Brother   . Diabetes Brother   . Prostate cancer Brother   . Prostate cancer Brother   . Stroke Sister     Social History   Socioeconomic History  . Marital status: Married    Spouse name: Not on file  . Number of children: Not on file  . Years of education: Not on file  . Highest education level: Not on file  Occupational History  . Not on file  Tobacco Use  . Smoking status: Never Smoker  . Smokeless tobacco: Never Used  Vaping Use  . Vaping Use: Never used  Substance and Sexual Activity  . Alcohol use: No    Alcohol/week: 0.0 standard drinks  . Drug use: No  . Sexual activity: Yes    Partners: Female  Other Topics Concern  . Not on file  Social History Narrative  . Not on file   Social Determinants of  Health   Financial Resource Strain: Not on file  Food Insecurity: Not on file  Transportation Needs: Not on file  Physical Activity: Not on file  Stress: Not on file  Social Connections: Not on file  Intimate Partner Violence: Not on file    ROS  General:  Negative for nexplained weight loss, fever Skin: Positive for new or changing mole, negative for sore that won't heal HEENT: Negative for trouble hearing, trouble seeing, ringing in ears, mouth sores, hoarseness, change in voice, dysphagia. CV:  Negative for chest pain, dyspnea, edema, palpitations Resp: Negative for cough, dyspnea, hemoptysis GI: Negative for nausea, vomiting, diarrhea, constipation, abdominal pain, melena, hematochezia. GU: Negative  for dysuria, incontinence, urinary hesitance, hematuria, vaginal or penile discharge, polyuria, sexual difficulty, lumps in testicle or breasts MSK: Negative for muscle cramps or aches, joint pain or swelling Neuro: Positive for numbness, negative for headaches, weakness, dizziness, passing out/fainting Psych: Negative for depression, anxiety, memory problems  Objective  Physical Exam Vitals:   01/04/21 0930  BP: 118/70  Pulse: 60  Temp: 98 F (36.7 C)  SpO2: 98%    BP Readings from Last 3 Encounters:  01/04/21 118/70  11/30/20 110/70  10/30/20 126/81   Wt Readings from Last 3 Encounters:  01/04/21 199 lb 9.6 oz (90.5 kg)  11/30/20 204 lb 3.2 oz (92.6 kg)  10/30/20 203 lb (92.1 kg)    Physical Exam Constitutional:      General: He is not in acute distress.    Appearance: He is not diaphoretic.  HENT:     Head: Normocephalic and atraumatic.   Cardiovascular:     Rate and Rhythm: Normal rate and regular rhythm.     Heart sounds: Normal heart sounds.     Comments: 2+ DP and PT pulses bilaterally Pulmonary:     Effort: Pulmonary effort is normal.     Breath sounds: Normal breath sounds.  Abdominal:     General: Bowel sounds are normal. There is no distension.     Palpations: Abdomen is soft.     Tenderness: There is no abdominal tenderness. There is no guarding or rebound.  Musculoskeletal:     Right lower leg: No edema.     Left lower leg: No edema.  Skin:    General: Skin is warm and dry.  Neurological:     Mental Status: He is alert.     Comments: 5/5 strength in bilateral biceps, triceps, grip, quads, hamstrings, plantar and dorsiflexion, sensation to light touch intact in bilateral UE and LE, normal gait      Assessment/Plan:   Problem List Items Addressed This Visit    Hyperlipidemia   Encounter for general adult medical examination with abnormal findings    Physical exam completed.  He will continue healthy diet.  Discussed increasing vegetable and  fruit intake.  He will continue with exercise.  Prostate cancer screening to be completed today.  We will request vaccine records from his prior PCP to determine if he has had the Shingrix vaccine.  Lab work previously ordered will be completed today.      Prediabetes   Overweight   Numbness of left foot    I suspect this is related to nerve impingement when he is sitting down.  He has a benign exam.  He was provided with exercises for his back to see if that would help.  If this worsens or persists he will let us know to consider further evaluation.  Seborrheic keratosis    Discussed that the lesion appears benign.  Advised to monitor for any significant or drastic changes over a short period of time.       Other Visit Diagnoses    Prostate cancer screening         Patient reports his secondary insurance covers a physical once yearly.  Return in about 1 year (around 01/04/2022) for yearly exam.  This visit occurred during the SARS-CoV-2 public health emergency.  Safety protocols were in place, including screening questions prior to the visit, additional usage of staff PPE, and extensive cleaning of exam room while observing appropriate contact time as indicated for disinfecting solutions.    Tommi Rumps, MD [place]

## 2021-01-04 NOTE — Assessment & Plan Note (Signed)
I suspect this is related to nerve impingement when he is sitting down.  He has a benign exam.  He was provided with exercises for his back to see if that would help.  If this worsens or persists he will let us know to consider further evaluation.

## 2021-01-04 NOTE — Assessment & Plan Note (Signed)
Discussed that the lesion appears benign.  Advised to monitor for any significant or drastic changes over a short period of time.

## 2021-01-04 NOTE — Patient Instructions (Signed)
Nice to see you. If you notice significant change in the spot on your left temple please let me know. Please try the exercises for your back to see if that will help with your foot. We will contact you with your lab results.   Back Exercises The following exercises strengthen the muscles that help to support the trunk and back. They also help to keep the lower back flexible. Doing these exercises can help to prevent back pain or lessen existing pain.  If you have back pain or discomfort, try doing these exercises 2-3 times each day or as told by your health care provider.  As your pain improves, do them once each day, but increase the number of times that you repeat the steps for each exercise (do more repetitions).  To prevent the recurrence of back pain, continue to do these exercises once each day or as told by your health care provider. Do exercises exactly as told by your health care provider and adjust them as directed. It is normal to feel mild stretching, pulling, tightness, or discomfort as you do these exercises, but you should stop right away if you feel sudden pain or your pain gets worse. Exercises Single knee to chest Repeat these steps 3-5 times for each leg: 1. Lie on your back on a firm bed or the floor with your legs extended. 2. Bring one knee to your chest. Your other leg should stay extended and in contact with the floor. 3. Hold your knee in place by grabbing your knee or thigh with both hands and hold. 4. Pull on your knee until you feel a gentle stretch in your lower back or buttocks. 5. Hold the stretch for 10-30 seconds. 6. Slowly release and straighten your leg. Pelvic tilt Repeat these steps 5-10 times: 1. Lie on your back on a firm bed or the floor with your legs extended. 2. Bend your knees so they are pointing toward the ceiling and your feet are flat on the floor. 3. Tighten your lower abdominal muscles to press your lower back against the floor. This  motion will tilt your pelvis so your tailbone points up toward the ceiling instead of pointing to your feet or the floor. 4. With gentle tension and even breathing, hold this position for 5-10 seconds. Cat-cow Repeat these steps until your lower back becomes more flexible: 1. Get into a hands-and-knees position on a firm surface. Keep your hands under your shoulders, and keep your knees under your hips. You may place padding under your knees for comfort. 2. Let your head hang down toward your chest. Contract your abdominal muscles and point your tailbone toward the floor so your lower back becomes rounded like the back of a cat. 3. Hold this position for 5 seconds. 4. Slowly lift your head, let your abdominal muscles relax and point your tailbone up toward the ceiling so your back forms a sagging arch like the back of a cow. 5. Hold this position for 5 seconds.   Press-ups Repeat these steps 5-10 times: 1. Lie on your abdomen (face-down) on the floor. 2. Place your palms near your head, about shoulder-width apart. 3. Keeping your back as relaxed as possible and keeping your hips on the floor, slowly straighten your arms to raise the top half of your body and lift your shoulders. Do not use your back muscles to raise your upper torso. You may adjust the placement of your hands to make yourself more comfortable. 4. Hold this  position for 5 seconds while you keep your back relaxed. 5. Slowly return to lying flat on the floor.   Bridges Repeat these steps 10 times: 1. Lie on your back on a firm surface. 2. Bend your knees so they are pointing toward the ceiling and your feet are flat on the floor. Your arms should be flat at your sides, next to your body. 3. Tighten your buttocks muscles and lift your buttocks off the floor until your waist is at almost the same height as your knees. You should feel the muscles working in your buttocks and the back of your thighs. If you do not feel these muscles,  slide your feet 1-2 inches farther away from your buttocks. 4. Hold this position for 3-5 seconds. 5. Slowly lower your hips to the starting position, and allow your buttocks muscles to relax completely. If this exercise is too easy, try doing it with your arms crossed over your chest.   Abdominal crunches Repeat these steps 5-10 times: 1. Lie on your back on a firm bed or the floor with your legs extended. 2. Bend your knees so they are pointing toward the ceiling and your feet are flat on the floor. 3. Cross your arms over your chest. 4. Tip your chin slightly toward your chest without bending your neck. 5. Tighten your abdominal muscles and slowly raise your trunk (torso) high enough to lift your shoulder blades a tiny bit off the floor. Avoid raising your torso higher than that because it can put too much stress on your low back and does not help to strengthen your abdominal muscles. 6. Slowly return to your starting position. Back lifts Repeat these steps 5-10 times: 1. Lie on your abdomen (face-down) with your arms at your sides, and rest your forehead on the floor. 2. Tighten the muscles in your legs and your buttocks. 3. Slowly lift your chest off the floor while you keep your hips pressed to the floor. Keep the back of your head in line with the curve in your back. Your eyes should be looking at the floor. 4. Hold this position for 3-5 seconds. 5. Slowly return to your starting position. Contact a health care provider if:  Your back pain or discomfort gets much worse when you do an exercise.  Your worsening back pain or discomfort does not lessen within 2 hours after you exercise. If you have any of these problems, stop doing these exercises right away. Do not do them again unless your health care provider says that you can. Get help right away if:  You develop sudden, severe back pain. If this happens, stop doing the exercises right away. Do not do them again unless your health  care provider says that you can. This information is not intended to replace advice given to you by your health care provider. Make sure you discuss any questions you have with your health care provider. Document Revised: 11/25/2018 Document Reviewed: 04/22/2018 Elsevier Patient Education  [place].

## 2021-01-04 NOTE — Assessment & Plan Note (Signed)
Physical exam completed.  He will continue healthy diet.  Discussed increasing vegetable and fruit intake.  He will continue with exercise.  Prostate cancer screening to be completed today.  We will request vaccine records from his prior PCP to determine if he has had the Shingrix vaccine.  Lab work previously ordered will be completed today.

## 2021-01-07 LAB — COMPREHENSIVE METABOLIC PANEL
ALT: 14 U/L (ref 0–53)
AST: 21 U/L (ref 0–37)
Albumin: 4.3 g/dL (ref 3.5–5.2)
Alkaline Phosphatase: 57 U/L (ref 39–117)
BUN: 18 mg/dL (ref 6–23)
CO2: 27 mEq/L (ref 19–32)
Calcium: 9.6 mg/dL (ref 8.4–10.5)
Chloride: 106 mEq/L (ref 96–112)
Creatinine, Ser: 1.35 mg/dL (ref 0.40–1.50)
GFR: 53.14 mL/min — ABNORMAL LOW (ref >60.00)
Glucose, Bld: 95 mg/dL (ref 70–99)
Potassium: 4 mEq/L (ref 3.5–5.1)
Sodium: 142 mEq/L (ref 135–145)
Total Bilirubin: 0.6 mg/dL (ref 0.2–1.2)
Total Protein: 7 g/dL (ref 6.0–8.3)

## 2021-01-07 LAB — LIPID PANEL
Cholesterol: 148 mg/dL (ref 0–200)
HDL: 54.4 mg/dL (ref >39.00)
LDL Cholesterol: 84 mg/dL (ref 0–99)
NonHDL: 93.58
Total CHOL/HDL Ratio: 3
Triglycerides: 47 mg/dL (ref 0.0–149.0)
VLDL: 9.4 mg/dL (ref 0.0–40.0)

## 2021-01-09 ENCOUNTER — Other Ambulatory Visit: Payer: Self-pay | Admitting: Family Medicine

## 2021-01-09 DIAGNOSIS — R972 Elevated prostate specific antigen [PSA]: Secondary | ICD-10-CM

## 2021-01-09 DIAGNOSIS — R944 Abnormal results of kidney function studies: Secondary | ICD-10-CM

## 2021-01-10 NOTE — Progress Notes (Signed)
Phone kept ringing, no VM.  Yeng Perz,cma

## 2021-01-31 ENCOUNTER — Encounter: Payer: Self-pay | Admitting: Family Medicine

## 2021-01-31 ENCOUNTER — Other Ambulatory Visit: Payer: Self-pay

## 2021-01-31 ENCOUNTER — Telehealth (INDEPENDENT_AMBULATORY_CARE_PROVIDER_SITE_OTHER): Payer: Medicare Other | Admitting: Family Medicine

## 2021-01-31 DIAGNOSIS — U071 COVID-19: Secondary | ICD-10-CM

## 2021-01-31 NOTE — Patient Instructions (Addendum)
I am glad to hear that you are improving.  I think it is reasonable to continue masking around others for the next few days.  There is a link provided below for more information if needed.   Everyone who has presumed or confirmed COVID-19 should stay home and isolate from other people for at least 5 full days (day 0 is the first day of symptoms or the date of the day of the positive viral test for asymptomatic persons). You can end isolation after 5 full days if you are fever-free for 24 hours without the use of fever-reducing medication and your other symptoms have improved (Loss of taste and smell may persist for weeks or months after recovery and need not delay the end of isolation). You should continue to wear a well-fitting mask around others at home and in public for 5 additional days (day 6 through day 10) after the end of your 5-day isolation period. If you are unable to wear a mask when around others, you should continue to isolate for a full 10 days. Avoid people who have weakened immune systems or are more likely to get very sick from COVID-19, and nursing homes and other high-risk settings, until after at least 10 days.  If you continue to have fever or your other symptoms have not improved after 5 days of isolation, you should wait to end your isolation until you are fever-free for 24 hours without the use of fever-reducing medication and your other symptoms have improved. Continue to wear a well-fitting mask through day 10.   [email or web address].[email or web address]

## 2021-01-31 NOTE — Progress Notes (Signed)
Virtual Visit via Video Note  I connected with Jason Hayes on 01/31/21 at 3:19 PM by a video enabled telemedicine application and verified that I am speaking with the correct person using two identifiers.  Patient location: home  My location: office - [place]    I discussed the limitations, risks, security and privacy concerns of performing an evaluation and management service by telephone and the availability of in person appointments. I also discussed with the patient that there may be a patient responsible charge related to this service. The patient expressed understanding and agreed to proceed, consent obtained  Chief complaint:  Chief Complaint  Patient presents with   Covid Positive    Tested positive on 6/23 and positive again last night, symptoms include chest tightness and dry cough     History of Present Illness: Jason Hayes is a 70 y.o. male  Covid 75  infection: Chest congestion, runny nose 9-10 days ago. Initial test for Covid 19 7 days ago - rapid test positive. No medical eval or antivirals. No otc meds except lozenges.  Initially sore throat for a few days, then resolved. Tested again last night - still positive test (rapid test). Feeling well overall. Has a part time job to see if able to work today. Works 2days per week,  Still occasional slight cough. Overall better.No runny nose/sore throat. Dry cough.  No recent fever, no recent tylenol or nsaid.  Covid vaccine last year and 2 boosters.  No hx of immune disorder or immune suppressant meds.  Resting, eating and drinking ok.  Supposed to speak at church in 3 days.   Patient Active Problem List   Diagnosis Date Noted   Numbness of left foot 01/04/2021   Seborrheic keratosis 01/04/2021   Left leg pain 11/30/2020   Elevated PSA 12/23/2019   Overweight 08/15/2018   Family history of prostate cancer 08/15/2018   BPH without obstruction/lower urinary tract symptoms 08/05/2017   Prediabetes 12/18/2016    Encounter for general adult medical examination with abnormal findings 01/22/2016   Hyperlipidemia 06/20/2015   Past Medical History:  Diagnosis Date   Asthma    childhood   Colon polyps    Hyperlipidemia    Kidney stones    Past Surgical History:  Procedure Laterality Date   CATARACT EXTRACTION W/PHACO Right 10/16/2020   Procedure: CATARACT EXTRACTION PHACO AND INTRAOCULAR LENS PLACEMENT ([place]) RIGHT 5.67 00:36.1;  Surgeon: Birder Robson, MD;  Location: [place];  Service: Ophthalmology;  Laterality: Right;   CATARACT EXTRACTION W/PHACO Left 10/30/2020   Procedure: CATARACT EXTRACTION PHACO AND INTRAOCULAR LENS PLACEMENT ([place]) LEFT;  Surgeon: Birder Robson, MD;  Location: [place];  Service: Ophthalmology;  Laterality: Left;  5.89 0:31.4   COLONOSCOPY WITH PROPOFOL N/A 08/23/2018   Procedure: COLONOSCOPY WITH PROPOFOL;  Surgeon: Jonathon Bellows, MD;  Location: [facility] ENDOSCOPY;  Service: Gastroenterology;  Laterality: N/A;   EYE SURGERY     FOOT SURGERY     HERNIA REPAIR     KNEE SURGERY     No Known Allergies Prior to Admission medications   Medication Sig Start Date End Date Taking? Authorizing Provider  atorvastatin (LIPITOR) 20 MG tablet Take 1 tablet (20 mg total) by mouth at bedtime. 11/30/20  Yes Leone Haven, MD   Social History   Socioeconomic History   Marital status: Married    Spouse name: Not on file   Number of children: Not on file   Years of education: Not on file   Highest  education level: Not on file  Occupational History   Not on file  Tobacco Use   Smoking status: Never   Smokeless tobacco: Never  Vaping Use   Vaping Use: Never used  Substance and Sexual Activity   Alcohol use: No    Alcohol/week: 0.0 standard drinks   Drug use: No   Sexual activity: Yes    Partners: Female  Other Topics Concern   Not on file  Social History Narrative   Not on file   Social Determinants of Health   Financial Resource Strain: Not  on file  Food Insecurity: Not on file  Transportation Needs: Not on file  Physical Activity: Not on file  Stress: Not on file  Social Connections: Not on file  Intimate Partner Violence: Not on file    Observations/Objective: There were no vitals filed for this visit. Nontoxic appearance on video, no respiratory distress, appropriate responses, no cough.  All questions were answered with understanding of plan expressed  Assessment and Plan: COVID-19 virus infection Approximately day 7 from his first positive test, but day 9 or 10 from initial symptoms.  Symptoms have improved, and no fever off antipyretics.  Positive rapid test but less likely contagious at this time.  Did recommend continued masking for the next few days for a full 10 days, can repeat rapid test at that time if he would prefer.  Recommendations from [facility] discussed and sent via [place].  RTC precautions.  Follow Up Instructions: As needed.   I discussed the assessment and treatment plan with the patient. The patient was provided an opportunity to ask questions and all were answered. The patient agreed with the plan and demonstrated an understanding of the instructions.   The patient was advised to call back or seek an in-person evaluation if the symptoms worsen or if the condition fails to improve as anticipated.  I provided 21 minutes of non-face-to-face time during this encounter.   Wendie Agreste, MD

## 2021-04-17 DIAGNOSIS — Z23 Encounter for immunization: Secondary | ICD-10-CM | POA: Diagnosis not present

## 2021-05-26 ENCOUNTER — Other Ambulatory Visit: Payer: Self-pay | Admitting: Family Medicine

## 2021-05-26 DIAGNOSIS — E785 Hyperlipidemia, unspecified: Secondary | ICD-10-CM

## 2021-05-27 DIAGNOSIS — Z961 Presence of intraocular lens: Secondary | ICD-10-CM | POA: Diagnosis not present

## 2021-09-23 ENCOUNTER — Other Ambulatory Visit: Payer: Self-pay

## 2021-09-23 DIAGNOSIS — E785 Hyperlipidemia, unspecified: Secondary | ICD-10-CM

## 2021-09-23 MED ORDER — ATORVASTATIN CALCIUM 20 MG PO TABS: 20.0000 mg | ORAL_TABLET | Freq: Every day | ORAL | 1 refills | Status: DC

## 2021-12-20 ENCOUNTER — Telehealth: Payer: Self-pay | Admitting: Family Medicine

## 2021-12-20 NOTE — Telephone Encounter (Signed)
Pt called stating he is changing his pharmacy to [facility] and want his medication to go there

## 2021-12-20 NOTE — Telephone Encounter (Signed)
Pharmacy changed, patient  does not need refills at this time

## 2022-01-06 ENCOUNTER — Ambulatory Visit (INDEPENDENT_AMBULATORY_CARE_PROVIDER_SITE_OTHER): Payer: Medicare Other | Admitting: Family Medicine

## 2022-01-06 ENCOUNTER — Telehealth: Payer: Self-pay | Admitting: Family Medicine

## 2022-01-06 ENCOUNTER — Encounter: Payer: Self-pay | Admitting: Family Medicine

## 2022-01-06 DIAGNOSIS — R7303 Prediabetes: Secondary | ICD-10-CM | POA: Diagnosis not present

## 2022-01-06 DIAGNOSIS — R972 Elevated prostate specific antigen [PSA]: Secondary | ICD-10-CM | POA: Diagnosis not present

## 2022-01-06 DIAGNOSIS — E785 Hyperlipidemia, unspecified: Secondary | ICD-10-CM | POA: Diagnosis not present

## 2022-01-06 LAB — LIPID PANEL
Cholesterol: 147 mg/dL (ref 0–200)
HDL: 56.8 mg/dL (ref >39.00)
LDL Cholesterol: 81 mg/dL (ref 0–99)
NonHDL: 89.85
Total CHOL/HDL Ratio: 3
Triglycerides: 44 mg/dL (ref 0.0–149.0)
VLDL: 8.8 mg/dL (ref 0.0–40.0)

## 2022-01-06 LAB — COMPREHENSIVE METABOLIC PANEL
ALT: 28 U/L (ref 0–53)
AST: 37 U/L (ref 0–37)
Albumin: 4.1 g/dL (ref 3.5–5.2)
Alkaline Phosphatase: 67 U/L (ref 39–117)
BUN: 16 mg/dL (ref 6–23)
CO2: 29 mEq/L (ref 19–32)
Calcium: 9.7 mg/dL (ref 8.4–10.5)
Chloride: 107 mEq/L (ref 96–112)
Creatinine, Ser: 1.46 mg/dL (ref 0.40–1.50)
GFR: 48.04 mL/min — ABNORMAL LOW (ref >60.00)
Glucose, Bld: 98 mg/dL (ref 70–99)
Potassium: 4.2 mEq/L (ref 3.5–5.1)
Sodium: 143 mEq/L (ref 135–145)
Total Bilirubin: 1.2 mg/dL (ref 0.2–1.2)
Total Protein: 6.9 g/dL (ref 6.0–8.3)

## 2022-01-06 LAB — PSA: PSA: 5.41 ng/mL — ABNORMAL HIGH (ref 0.10–4.00)

## 2022-01-06 LAB — HEMOGLOBIN A1C: Hgb A1c MFr Bld: 5.8 % (ref 4.6–6.5)

## 2022-01-06 NOTE — Progress Notes (Signed)
Patient has had prevnar 20 on 04/17/2021 and he got his last covid booster same day at [facility].  Chart has been updated.  Jason Hayes,cma

## 2022-01-06 NOTE — Telephone Encounter (Signed)
I called and LVM informing the patient that he needed a shingrix vaccine at his pharmacy and to call back.  Jilliana Burkes,cma

## 2022-01-06 NOTE — Patient Instructions (Signed)
Nice to see you. Please try to reduce your sweet tea intake. Please continue healthy activity levels. We will get lab work today and contact you with the results. We will let you know the status of your shingles vaccine through the pharmacy.

## 2022-01-06 NOTE — Progress Notes (Signed)
Jason Rumps, MD Phone: [phone]  Jason Hayes is a 71 y.o. male who presents today for f/u.  HYPERLIPIDEMIA Symptoms Chest pain on exertion:  no   Leg claudication:   no Medications: Compliance- taking lipitor Right upper quadrant pain- no  Muscle aches- no Lipid Panel     Component Value Date/Time   CHOL 148 01/04/2021 1000   CHOL 151 06/14/2015 0854   TRIG 47.0 01/04/2021 1000   HDL 54.40 01/04/2021 1000   HDL 62 06/14/2015 0854   CHOLHDL 3 01/04/2021 1000   VLDL 9.4 01/04/2021 1000   LDLCALC 84 01/04/2021 1000   LDLCALC 80 06/14/2015 0854   LABVLDL 9 06/14/2015 0854   Prediabetes: Patient notes he eats anything and everything.  Does not eat an excessive amount of fried foods or sweets.  Does have sweet tea 16 to 18 ounces daily.  Not much soda.  He does have a handful of peanuts daily.  He exercises Monday, Wednesday, and Friday by going to the gym and doing cardio and lifting weights.  He also has an active part-time job.  Elevated PSA: Patient has had a history of intermittently elevated PSA with negative work-up through urology.  He is due for recheck.  He does have a history of prostate cancer in his family.  Social History   Tobacco Use  Smoking Status Never  Smokeless Tobacco Never    Current Outpatient Medications on File Prior to Visit  Medication Sig Dispense Refill   atorvastatin (LIPITOR) 20 MG tablet Take 1 tablet (20 mg total) by mouth at bedtime. 90 tablet 1   No current facility-administered medications on file prior to visit.     ROS see history of present illness  Objective  Physical Exam Vitals:   01/06/22 0935  BP: 110/60  Pulse: (!) 57  Temp: 97.7 F (36.5 C)  SpO2: 98%    BP Readings from Last 3 Encounters:  01/06/22 110/60  01/04/21 118/70  11/30/20 110/70   Wt Readings from Last 3 Encounters:  01/06/22 199 lb 12.8 oz (90.6 kg)  01/04/21 199 lb 9.6 oz (90.5 kg)  11/30/20 204 lb 3.2 oz (92.6 kg)    Physical  Exam Constitutional:      General: He is not in acute distress.    Appearance: He is not diaphoretic.  Cardiovascular:     Rate and Rhythm: Normal rate and regular rhythm.     Heart sounds: Normal heart sounds.  Pulmonary:     Effort: Pulmonary effort is normal.     Breath sounds: Normal breath sounds.  Abdominal:     General: Bowel sounds are normal. There is no distension.     Palpations: Abdomen is soft.     Tenderness: There is no abdominal tenderness.  Musculoskeletal:     Right lower leg: No edema.     Left lower leg: No edema.  Skin:    General: Skin is warm and dry.  Neurological:     Mental Status: He is alert.     Assessment/Plan: Please see individual problem list.  Problem List Items Addressed This Visit     Hyperlipidemia (Chronic)    Continue Lipitor 20 mg daily.  Check labs.       Relevant Orders   Comp Met (CMET)   Lipid panel   Prediabetes (Chronic)    Encouraged continued healthy activity levels.  Discussed reducing sweet tea intake.  Discussed trying to prevent things from getting worse with the prediabetes.  Relevant Orders   HgB A1c   Elevated PSA    Chronic intermittent issue with prior negative work-up.  We will check a PSA today.       Relevant Orders   PSA     Health Maintenance: Patient believes he may have gotten 1 shingles vaccine at the pharmacy previously.  He notes he had 4 or 5 COVID vaccines with the most recent one being last September.  I did discuss that the CDC recently gave guidance on an additional updated COVID booster for patients above 65. We will request vaccine records from the pharmacy and update him if he needs further shingrix vaccines.   Return in about 1 year (around 01/07/2023) for Yearly follow-up.   Jason Rumps, MD [place]

## 2022-01-06 NOTE — Telephone Encounter (Signed)
Can you call the patient and let him know it looks like he has not gotten the shingrix vaccine at the pharmacy previously? I would recommend that he proceed with the shingrix vaccine at the pharmacy.

## 2022-01-06 NOTE — Assessment & Plan Note (Signed)
Chronic intermittent issue with prior negative work-up.  We will check a PSA today.

## 2022-01-06 NOTE — Assessment & Plan Note (Signed)
Encouraged continued healthy activity levels.  Discussed reducing sweet tea intake.  Discussed trying to prevent things from getting worse with the prediabetes.

## 2022-01-06 NOTE — Telephone Encounter (Signed)
-----   Message from Gordy Councilman, [place] sent at 01/06/2022 10:08 AM EDT -----    ----- Message ----- From: Leone Haven, MD Sent: 01/06/2022   9:57 AM EDT To: Gordy Councilman, CMA  Can you call [facility] on [address] and check with the pharmacy to see what vaccines he has had there?  Thanks.

## 2022-01-06 NOTE — Assessment & Plan Note (Signed)
Continue Lipitor 20 mg daily.  Check labs. 

## 2022-01-08 ENCOUNTER — Telehealth: Payer: Self-pay

## 2022-01-08 NOTE — Telephone Encounter (Signed)
Patient called back and was informed to get his shing rix at his pharmacy and he understood.  Rolande Moe,cma

## 2022-01-10 ENCOUNTER — Other Ambulatory Visit: Payer: Self-pay

## 2022-01-10 DIAGNOSIS — R944 Abnormal results of kidney function studies: Secondary | ICD-10-CM

## 2022-01-27 ENCOUNTER — Encounter: Payer: Medicare Other | Admitting: Family Medicine

## 2022-02-15 NOTE — Telephone Encounter (Signed)
Error. ng 

## 2022-03-15 ENCOUNTER — Other Ambulatory Visit: Payer: Self-pay | Admitting: Family Medicine

## 2022-03-15 DIAGNOSIS — E785 Hyperlipidemia, unspecified: Secondary | ICD-10-CM

## 2022-03-26 ENCOUNTER — Telehealth: Payer: Self-pay | Admitting: Family Medicine

## 2022-03-26 NOTE — Telephone Encounter (Signed)
Patient called and stated he is still waiting for his atorvastatin (LIPITOR) 20 MG tablet to be refilled. He called his pharmacy and they told him that they did not received a prescription from his provider. It was sent on 03/16/2022 and confirmed, [facility] said they did not get it.

## 2022-03-26 NOTE — Telephone Encounter (Signed)
I called and spoke with the patient and informed him that his RX was sent and the pharmacy stated that they are behind and they are working on it and I informed the patient and he understood.  Tyjae Issa,cma

## 2022-05-30 DIAGNOSIS — Z961 Presence of intraocular lens: Secondary | ICD-10-CM | POA: Diagnosis not present

## 2022-06-11 ENCOUNTER — Other Ambulatory Visit: Payer: Self-pay

## 2022-06-11 DIAGNOSIS — E785 Hyperlipidemia, unspecified: Secondary | ICD-10-CM

## 2022-06-11 MED ORDER — ATORVASTATIN CALCIUM 20 MG PO TABS: 20.0000 mg | ORAL_TABLET | Freq: Every day | ORAL | 1 refills | Status: DC

## 2022-09-22 ENCOUNTER — Telehealth: Payer: Self-pay | Admitting: Family Medicine

## 2022-09-22 NOTE — Telephone Encounter (Signed)
Prescription Request  09/22/2022  Is this a "Controlled Substance" medicine? No  LOV: 01/06/2022  What is the name of the medication or equipment? atorvastatin (LIPITOR) 20 MG tablet  Have you contacted your pharmacy to request a refill? Yes   Which pharmacy would you like this sent to?   [facility] [address] [place] [address] Phone: [phone] Fax: [phone]     Patient notified that their request is being sent to the clinical staff for review and that they should receive a response within 2 business days.   Please advise at Mobile [phone] (mobile)   As per pt, he changing supplier.

## 2022-09-23 ENCOUNTER — Other Ambulatory Visit: Payer: Self-pay

## 2022-09-23 DIAGNOSIS — E785 Hyperlipidemia, unspecified: Secondary | ICD-10-CM

## 2022-09-23 MED ORDER — ATORVASTATIN CALCIUM 20 MG PO TABS: 20.0000 mg | ORAL_TABLET | Freq: Every day | ORAL | 1 refills | Status: DC

## 2022-09-23 NOTE — Telephone Encounter (Signed)
sent

## 2022-11-17 ENCOUNTER — Other Ambulatory Visit: Payer: Self-pay | Admitting: Family Medicine

## 2022-11-17 DIAGNOSIS — E785 Hyperlipidemia, unspecified: Secondary | ICD-10-CM

## 2023-01-12 ENCOUNTER — Ambulatory Visit (INDEPENDENT_AMBULATORY_CARE_PROVIDER_SITE_OTHER): Payer: Medicare Other | Admitting: Family Medicine

## 2023-01-12 ENCOUNTER — Encounter: Payer: Self-pay | Admitting: Family Medicine

## 2023-01-12 VITALS — BP 118/72 | HR 57 | Temp 98.2°F | Ht 71.0 in | Wt 188.8 lb

## 2023-01-12 DIAGNOSIS — R21 Rash and other nonspecific skin eruption: Secondary | ICD-10-CM

## 2023-01-12 DIAGNOSIS — R7303 Prediabetes: Secondary | ICD-10-CM

## 2023-01-12 DIAGNOSIS — R972 Elevated prostate specific antigen [PSA]: Secondary | ICD-10-CM

## 2023-01-12 DIAGNOSIS — M25512 Pain in left shoulder: Secondary | ICD-10-CM | POA: Diagnosis not present

## 2023-01-12 DIAGNOSIS — Z0001 Encounter for general adult medical examination with abnormal findings: Secondary | ICD-10-CM | POA: Diagnosis not present

## 2023-01-12 DIAGNOSIS — E785 Hyperlipidemia, unspecified: Secondary | ICD-10-CM

## 2023-01-12 LAB — COMPREHENSIVE METABOLIC PANEL
ALT: 20 U/L (ref 0–53)
AST: 26 U/L (ref 0–37)
Albumin: 4.4 g/dL (ref 3.5–5.2)
Alkaline Phosphatase: 58 U/L (ref 39–117)
BUN: 17 mg/dL (ref 6–23)
CO2: 29 mEq/L (ref 19–32)
Calcium: 9.9 mg/dL (ref 8.4–10.5)
Chloride: 103 mEq/L (ref 96–112)
Creatinine, Ser: 1.51 mg/dL — ABNORMAL HIGH (ref 0.40–1.50)
GFR: 45.81 mL/min — ABNORMAL LOW (ref >60.00)
Glucose, Bld: 99 mg/dL (ref 70–99)
Potassium: 4.2 mEq/L (ref 3.5–5.1)
Sodium: 140 mEq/L (ref 135–145)
Total Bilirubin: 1.2 mg/dL (ref 0.2–1.2)
Total Protein: 7.1 g/dL (ref 6.0–8.3)

## 2023-01-12 LAB — LIPID PANEL
Cholesterol: 160 mg/dL (ref 0–200)
HDL: 63.5 mg/dL (ref >39.00)
LDL Cholesterol: 86 mg/dL (ref 0–99)
NonHDL: 96.95
Total CHOL/HDL Ratio: 3
Triglycerides: 55 mg/dL (ref 0.0–149.0)
VLDL: 11 mg/dL (ref 0.0–40.0)

## 2023-01-12 LAB — PSA: PSA: 9.84 ng/mL — ABNORMAL HIGH (ref 0.10–4.00)

## 2023-01-12 LAB — HEMOGLOBIN A1C: Hgb A1c MFr Bld: 5.8 % (ref 4.6–6.5)

## 2023-01-12 NOTE — Assessment & Plan Note (Signed)
Physical exam completed.  He will continue healthy diet and exercise.  Discussed reducing sweet tea intake.  Prostate cancer screening to be completed today.  Lab work as outlined.  Patient will get COVID vaccination when they updated again in the fall.  We will confirm when he got his Shingrix vaccine.

## 2023-01-12 NOTE — Progress Notes (Signed)
Marikay Alar, MD Phone: [phone]  Jason Hayes is a 72 y.o. male who presents today for CPE.  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  He started wearing long sleeves and has not had any issues with this.  The rash would last about a week and would itch.  Left shoulder pain: Patient notes 3 months ago he felt like he injured his left shoulder while lifting weights.  Pain improved over about a month.  He had some reduced strength in his left arm after this though that is progressively improving as well.   Active Ambulatory Problems    Diagnosis Date Noted   Hyperlipidemia 06/20/2015   Encounter for general adult medical examination with abnormal findings 01/22/2016   Prediabetes 12/18/2016   BPH without obstruction/lower urinary tract symptoms 08/05/2017   Overweight 08/15/2018   Family history of prostate cancer 08/15/2018   Elevated PSA 12/23/2019   Left leg pain 11/30/2020   Numbness of left foot 01/04/2021   Seborrheic keratosis 01/04/2021   Rash 01/12/2023   Left shoulder pain 01/12/2023   Resolved Ambulatory Problems    Diagnosis Date Noted   Viral respiratory illness 08/05/2017   Past Medical History:  Diagnosis Date   Asthma    Colon polyps    Kidney stones     Family History  Problem  Relation Age of Onset   Hyperlipidemia Mother    Hypertension Mother    Diabetes Mother    Heart disease Mother    Hypertension Father    Hyperlipidemia Father    Diabetes Father    Heart disease Father    Hyperlipidemia Sister    Hypertension Sister    Diabetes Sister    Cancer Sister    Ovarian cancer Sister    Hypertension Brother    Hyperlipidemia Brother    Diabetes Brother    Prostate cancer Brother    Prostate cancer Brother    Stroke Sister     Social History   Socioeconomic History   Marital status: Married    Spouse name: Not on file   Number of children: Not on file   Years of education: Not on file   Highest education level: Not on file  Occupational History   Not on file  Tobacco Use   Smoking status: Never   Smokeless tobacco: Never  Vaping Use   Vaping Use: Never used  Substance and Sexual Activity   Alcohol use: No    Alcohol/week: 0.0 standard drinks of alcohol   Drug use: No   Sexual activity: Yes    Partners: Female  Other Topics Concern   Not on file  Social History Narrative   Not on file   Social Determinants of Health   Financial Resource Strain: Not on file  Food Insecurity: Not on file  Transportation  Needs: Not on file  Physical Activity: Not on file  Stress: Not on file  Social Connections: Not on file  Intimate Partner Violence: Not on file    ROS  General:  Negative for nexplained weight loss, fever Skin: Negative for new or changing mole, sore that won't heal HEENT: Negative for trouble hearing, trouble seeing, ringing in ears, mouth sores, hoarseness, change in voice, dysphagia. CV:  Negative for chest pain, dyspnea, edema, palpitations Resp: Negative for cough, dyspnea, hemoptysis GI: Negative for nausea, vomiting, diarrhea, constipation, abdominal pain, melena, hematochezia. GU: Negative for dysuria, incontinence, urinary hesitance, hematuria, vaginal or penile discharge, polyuria, sexual difficulty, lumps in testicle  or breasts MSK: Negative for muscle cramps or aches, joint pain or swelling Neuro: Negative for headaches, weakness, numbness, dizziness, passing out/fainting Psych: Negative for depression, anxiety, memory problems  Objective  Physical Exam Vitals:   01/12/23 0827  BP: 118/72  Pulse: (!) 57  Temp: 98.2 F (36.8 C)  SpO2: 98%    BP Readings from Last 3 Encounters:  01/12/23 118/72  01/06/22 110/60  01/04/21 118/70   Wt Readings from Last 3 Encounters:  01/12/23 188 lb 12.8 oz (85.6 kg)  01/06/22 199 lb 12.8 oz (90.6 kg)  01/04/21 199 lb 9.6 oz (90.5 kg)    Physical Exam Constitutional:      General: He is not in acute distress.    Appearance: He is not diaphoretic.  HENT:     Head: Normocephalic and atraumatic.  Cardiovascular:     Rate and Rhythm: Normal rate and regular rhythm.     Heart sounds: Normal heart sounds.  Pulmonary:     Effort: Pulmonary effort is normal.     Breath sounds: Normal breath sounds.  Abdominal:     General: Bowel sounds are normal. There is no distension.     Palpations: Abdomen is soft.     Tenderness: There is no abdominal tenderness.  Musculoskeletal:     Right lower leg: No edema.     Left lower leg: No edema.     Comments: Left shoulder with full passive range of motion with no discomfort, negative empty can bilaterally  Lymphadenopathy:     Cervical: No cervical adenopathy.  Skin:    General: Skin is warm and dry.  Neurological:     Mental Status: He is alert.     Comments: 5/5 strength in bilateral biceps, triceps, grip, quads, hamstrings, plantar and dorsiflexion, sensation to light touch intact in bilateral UE and LE, normal gait  Psychiatric:        Mood and Affect: Mood normal.      Assessment/Plan:   Encounter for general adult medical examination with abnormal findings Assessment & Plan: Physical exam completed.  He will continue healthy diet and exercise.  Discussed reducing sweet tea intake.  Prostate cancer  screening to be completed today.  Lab work as outlined.  Patient will get COVID vaccination when they updated again in the fall.  We will confirm when he got his Shingrix vaccine.   Elevated PSA Assessment & Plan: Most recent PSA in the acceptable range for his age.  Recheck today.  Orders: -     PSA  Rash Assessment & Plan: Likely related to allergic contact dermatitis.  Patient wondered about allergy testing though advised that this really would not be worthwhile given that this was remedied by wearing a longsleeve shirt.   Acute pain of left shoulder Assessment & Plan: Resolved.  Possibly with rotator cuff  injury previously.  Discussed that strength should continue to improve.  Strength testing today was normal.  The patient will monitor.   Prediabetes -     Hemoglobin A1c  Hyperlipidemia, unspecified hyperlipidemia type -     Comprehensive metabolic panel -     Lipid panel    Return in about 1 year (around 01/12/2024) for physical.   Marikay Alar, MD [facility]

## 2023-01-12 NOTE — Assessment & Plan Note (Signed)
Likely related to allergic contact dermatitis.  Patient wondered about allergy testing though advised that this really would not be worthwhile given that this was remedied by wearing a longsleeve shirt.

## 2023-01-12 NOTE — Assessment & Plan Note (Signed)
Most recent PSA in the acceptable range for his age.  Recheck today.

## 2023-01-12 NOTE — Assessment & Plan Note (Signed)
Resolved.  Possibly with rotator cuff injury previously.  Discussed that strength should continue to improve.  Strength testing today was normal.  The patient will monitor.

## 2023-02-25 ENCOUNTER — Ambulatory Visit: Payer: BC Managed Care – PPO | Admitting: *Deleted

## 2023-02-25 VITALS — Ht 71.0 in | Wt 195.0 lb

## 2023-02-25 DIAGNOSIS — Z0000 Encounter for general adult medical examination without abnormal findings: Secondary | ICD-10-CM | POA: Diagnosis not present

## 2023-02-25 NOTE — Patient Instructions (Signed)
Patient was unable to self-report vital signs via telehealth due to a lack of equipment at home.  Mr. Jason Hayes , Thank you for taking time to come for your Medicare Wellness Visit. I appreciate your ongoing commitment to your health goals. Please review the following plan we discussed and let me know if I can assist you in the future.   These are the goals we discussed:  Goals      Patient Stated     Keep weight manageable        This is a list of the screening recommended for you and due dates:  Health Maintenance  Topic Date Due   Zoster (Shingles) Vaccine (1 of 2) Never done   COVID-19 Vaccine (4 - 2023-24 season) 04/04/2022   Flu Shot  03/05/2023   Colon Cancer Screening  08/24/2023   Medicare Annual Wellness Visit  02/25/2024   DTaP/Tdap/Td vaccine (2 - Td or Tdap) 08/06/2028   Pneumonia Vaccine  Completed   Hepatitis C Screening  Completed   HPV Vaccine  Aged Out    Advanced directives: Will bring to the office  Conditions/risks identified: None  Next appointment: Follow up in one year for your annual wellness visit. 02/29/24 @ 10:15  Preventive Care 65 Years and Older, Male  Preventive care refers to lifestyle choices and visits with your health care provider that can promote health and wellness. What does preventive care include? A yearly physical exam. This is also called an annual well check. Dental exams once or twice a year. Routine eye exams. Ask your health care provider how often you should have your eyes checked. Personal lifestyle choices, including: Daily care of your teeth and gums. Regular physical activity. Eating a healthy diet. Avoiding tobacco and drug use. Limiting alcohol use. Practicing safe sex. Taking low doses of aspirin every day. Taking vitamin and mineral supplements as recommended by your health care provider. What happens during an annual well check? The services and screenings done by your health care provider during your annual well  check will depend on your age, overall health, lifestyle risk factors, and family history of disease. Counseling  Your health care provider may ask you questions about your: Alcohol use. Tobacco use. Drug use. Emotional well-being. Home and relationship well-being. Sexual activity. Eating habits. History of falls. Memory and ability to understand (cognition). Work and work Astronomer. Screening  You may have the following tests or measurements: Height, weight, and BMI. Blood pressure. Lipid and cholesterol levels. These may be checked every 5 years, or more frequently if you are over 101 years old. Skin check. Lung cancer screening. You may have this screening every year starting at age 82 if you have a 30-pack-year history of smoking and currently smoke or have quit within the past 15 years. Fecal occult blood test (FOBT) of the stool. You may have this test every year starting at age 33. Flexible sigmoidoscopy or colonoscopy. You may have a sigmoidoscopy every 5 years or a colonoscopy every 10 years starting at age 79. Prostate cancer screening. Recommendations will vary depending on your family history and other risks. Hepatitis C blood test. Hepatitis B blood test. Sexually transmitted disease (STD) testing. Diabetes screening. This is done by checking your blood sugar (glucose) after you have not eaten for a while (fasting). You may have this done every 1-3 years. Abdominal aortic aneurysm (AAA) screening. You may need this if you are a current or former smoker. Osteoporosis. You may be screened starting at age  70 if you are at high risk. Talk with your health care provider about your test results, treatment options, and if necessary, the need for more tests. Vaccines  Your health care provider may recommend certain vaccines, such as: Influenza vaccine. This is recommended every year. Tetanus, diphtheria, and acellular pertussis (Tdap, Td) vaccine. You may need a Td booster  every 10 years. Zoster vaccine. You may need this after age 27. Pneumococcal 13-valent conjugate (PCV13) vaccine. One dose is recommended after age 23. Pneumococcal polysaccharide (PPSV23) vaccine. One dose is recommended after age 73. Talk to your health care provider about which screenings and vaccines you need and how often you need them. This information is not intended to replace advice given to you by your health care provider. Make sure you discuss any questions you have with your health care provider. Document Released: 08/17/2015 Document Revised: 04/09/2016 Document Reviewed: 05/22/2015 Elsevier Interactive Patient Education  2017 [employer].  Fall Prevention in the Home Falls can cause injuries. They can happen to people of all ages. There are many things you can do to make your home safe and to help prevent falls. What can I do on the outside of my home? Regularly fix the edges of walkways and driveways and fix any cracks. Remove anything that might make you trip as you walk through a door, such as a raised step or threshold. Trim any bushes or trees on the path to your home. Use bright outdoor lighting. Clear any walking paths of anything that might make someone trip, such as rocks or tools. Regularly check to see if handrails are loose or broken. Make sure that both sides of any steps have handrails. Any raised decks and porches should have guardrails on the edges. Have any leaves, snow, or ice cleared regularly. Use sand or salt on walking paths during winter. Clean up any spills in your garage right away. This includes oil or grease spills. What can I do in the bathroom? Use night lights. Install grab bars by the toilet and in the tub and shower. Do not use towel bars as grab bars. Use non-skid mats or decals in the tub or shower. If you need to sit down in the shower, use a plastic, non-slip stool. Keep the floor dry. Clean up any water that spills on the floor as soon  as it happens. Remove soap buildup in the tub or shower regularly. Attach bath mats securely with double-sided non-slip rug tape. Do not have throw rugs and other things on the floor that can make you trip. What can I do in the bedroom? Use night lights. Make sure that you have a light by your bed that is easy to reach. Do not use any sheets or blankets that are too big for your bed. They should not hang down onto the floor. Have a firm chair that has side arms. You can use this for support while you get dressed. Do not have throw rugs and other things on the floor that can make you trip. What can I do in the kitchen? Clean up any spills right away. Avoid walking on wet floors. Keep items that you use a lot in easy-to-reach places. If you need to reach something above you, use a strong step stool that has a grab bar. Keep electrical cords out of the way. Do not use floor polish or wax that makes floors slippery. If you must use wax, use non-skid floor wax. Do not have throw rugs and other  things on the floor that can make you trip. What can I do with my stairs? Do not leave any items on the stairs. Make sure that there are handrails on both sides of the stairs and use them. Fix handrails that are broken or loose. Make sure that handrails are as long as the stairways. Check any carpeting to make sure that it is firmly attached to the stairs. Fix any carpet that is loose or worn. Avoid having throw rugs at the top or bottom of the stairs. If you do have throw rugs, attach them to the floor with carpet tape. Make sure that you have a light switch at the top of the stairs and the bottom of the stairs. If you do not have them, ask someone to add them for you. What else can I do to help prevent falls? Wear shoes that: Do not have high heels. Have rubber bottoms. Are comfortable and fit you well. Are closed at the toe. Do not wear sandals. If you use a stepladder: Make sure that it is fully  opened. Do not climb a closed stepladder. Make sure that both sides of the stepladder are locked into place. Ask someone to hold it for you, if possible. Clearly mark and make sure that you can see: Any grab bars or handrails. First and last steps. Where the edge of each step is. Use tools that help you move around (mobility aids) if they are needed. These include: Canes. Walkers. Scooters. Crutches. Turn on the lights when you go into a dark area. Replace any light bulbs as soon as they burn out. Set up your furniture so you have a clear path. Avoid moving your furniture around. If any of your floors are uneven, fix them. If there are any pets around you, be aware of where they are. Review your medicines with your doctor. Some medicines can make you feel dizzy. This can increase your chance of falling. Ask your doctor what other things that you can do to help prevent falls. This information is not intended to replace advice given to you by your health care provider. Make sure you discuss any questions you have with your health care provider. Document Released: 05/17/2009 Document Revised: 12/27/2015 Document Reviewed: 08/25/2014 Elsevier Interactive Patient Education  2017 [employer].

## 2023-02-25 NOTE — Progress Notes (Signed)
Subjective:   Jason Hayes is a 72 y.o. male who presents for an Initial Medicare Annual Wellness Visit.  Visit Complete: Virtual  I connected with  Jason Hayes on 02/25/23 by a audio enabled telemedicine application and verified that I am speaking with the correct person using two identifiers.  Patient Location: Home  Provider Location: Office/Clinic  I discussed the limitations of evaluation and management by telemedicine. The patient expressed understanding and agreed to proceed.   Review of Systems     Cardiac Risk Factors include: advanced age (>30men, >49 women);dyslipidemia;male gender     Objective:    Today's Vitals   02/25/23 1016 02/25/23 1017  Weight: 195 lb (88.5 kg)   Height: 5\' 11"  (1.803 m)   PainSc:  3    Body mass index is 27.2 kg/m.     02/25/2023   10:31 AM 10/30/2020    9:26 AM 10/16/2020    7:35 AM 08/23/2018    8:26 AM 06/20/2015    8:52 AM  Advanced Directives  Does Patient Have a Medical Advance Directive? Yes Yes Yes Yes Yes  Type of Estate agent of [place];Living will Healthcare Power of [place];Living will Healthcare Power of [place];Living will Living will Healthcare Power of [place];Living will  Does patient want to make changes to medical advance directive?  No - Patient declined No - Patient declined    Copy of Healthcare Power of Attorney in Chart? No - copy requested No - copy requested No - copy requested      Current Medications (verified) Outpatient Encounter Medications as of 02/25/2023  Medication Sig   atorvastatin (LIPITOR) 20 MG tablet TAKE 1 TABLET BY MOUTH AT  BEDTIME   ibuprofen (ADVIL) 200 MG tablet Take 200 mg by mouth every 6 (six) hours as needed.   No facility-administered encounter medications on file as of 02/25/2023.    Allergies (verified) Patient has no known allergies.   History: Past Medical History:  Diagnosis Date   Asthma    childhood   Colon polyps    Hyperlipidemia     Kidney stones    Past Surgical History:  Procedure Laterality Date   CATARACT EXTRACTION W/PHACO Right 10/16/2020   Procedure: CATARACT EXTRACTION PHACO AND INTRAOCULAR LENS PLACEMENT (IOC) RIGHT 5.67 00:36.1;  Surgeon: Galen Manila, MD;  Location: [facility];  Service: Ophthalmology;  Laterality: Right;   CATARACT EXTRACTION W/PHACO Left 10/30/2020   Procedure: CATARACT EXTRACTION PHACO AND INTRAOCULAR LENS PLACEMENT (IOC) LEFT;  Surgeon: Galen Manila, MD;  Location: [facility];  Service: Ophthalmology;  Laterality: Left;  5.89 0:31.4   COLONOSCOPY WITH PROPOFOL N/A 08/23/2018   Procedure: COLONOSCOPY WITH PROPOFOL;  Surgeon: Wyline Mood, MD;  Location: [facility] END[facility];  Service: Gastroenterology;  Laterality: N/A;   EYE SURGERY     FOOT SURGERY     HERNIA REPAIR     KNEE SURGERY     Family History  Problem Relation Age of Onset   Hyperlipidemia Mother    Hypertension Mother    Diabetes Mother    Heart disease Mother    Hypertension Father    Hyperlipidemia Father    Diabetes Father    Heart disease Father    Hyperlipidemia Sister    Hypertension Sister    Diabetes Sister    Cancer Sister    Ovarian cancer Sister    Hypertension Brother    Hyperlipidemia Brother    Diabetes Brother    Prostate cancer Brother    Prostate  cancer Brother    Stroke Sister    Social History   Socioeconomic History   Marital status: Married    Spouse name: Not on file   Number of children: Not on file   Years of education: Not on file   Highest education level: Not on file  Occupational History   Not on file  Tobacco Use   Smoking status: Never   Smokeless tobacco: Never  Vaping Use   Vaping status: Never Used  Substance and Sexual Activity   Alcohol use: No    Alcohol/week: 0.0 standard drinks of alcohol   Drug use: No   Sexual activity: Yes    Partners: Female  Other Topics Concern   Not on file  Social History Narrative   Not on file   Social  Determinants of Health   Financial Resource Strain: Low Risk  (02/25/2023)   Overall Financial Resource Strain (CARDIA)    Difficulty of Paying Living Expenses: Not hard at all  Food Insecurity: No Food Insecurity (02/25/2023)   Hunger Vital Sign    Worried About Running Out of Food in the Last Year: Never true    Ran Out of Food in the Last Year: Never true  Transportation Needs: No Transportation Needs (02/25/2023)   PRAPARE - Administrator, Civil Service (Medical): No    Lack of Transportation (Non-Medical): No  Physical Activity: Sufficiently Active (02/25/2023)   Exercise Vital Sign    Days of Exercise per Week: 4 days    Minutes of Exercise per Session: 90 min  Stress: No Stress Concern Present (02/25/2023)   [employer] of Occupational Health - Occupational Stress Questionnaire    Feeling of Stress : Not at all  Social Connections: Moderately Integrated (02/25/2023)   Social Connection and Isolation Panel [NHANES]    Frequency of Communication with Friends and Family: More than three times a week    Frequency of Social Gatherings with Friends and Family: More than three times a week    Attends Religious Services: More than 4 times per year    Active Member of [employer] or Organizations: No    Attends Engineer, structural: Never    Marital Status: Married    Tobacco Counseling Counseling given: Not Answered   Clinical Intake:  Pre-visit preparation completed: Yes  Pain : 0-10 Pain Score: 3  Pain Type: Acute pain Pain Location: Knee Pain Orientation: Left Pain Descriptors / Indicators: Aching Pain Onset: 1 to 4 weeks ago Pain Frequency: Intermittent Pain Relieving Factors: Advil as needed  Pain Relieving Factors: Advil as needed  BMI - recorded: 27.2 Nutritional Status: BMI 25 -29 Overweight Nutritional Risks: None Diabetes: No  How often do you need to have someone help you when you read instructions, pamphlets, or other written materials  from your doctor or pharmacy?: 1 - Never  Interpreter Needed?: No  Information entered by :: R. Shannah Conteh LPN   Activities of Daily Living    02/25/2023   10:20 AM  In your present state of health, do you have any difficulty performing the following activities:  Hearing? 0  Vision? 0  Comment readers  Difficulty concentrating or making decisions? 0  Walking or climbing stairs? 0  Dressing or bathing? 0  Doing errands, shopping? 0  Preparing Food and eating ? N  Using the Toilet? N  In the past six months, have you accidently leaked urine? N  Do you have problems with loss of bowel control? N  Managing your Medications? N  Managing your Finances? N  Housekeeping or managing your Housekeeping? N    Patient Care Team: Glori Luis, MD as PCP - General (Family Medicine)  Indicate any recent Medical Services you may have received from other than [facility] providers in the past year (date may be approximate).     Assessment:   This is a routine wellness examination for [place].  Hearing/Vision screen Hearing Screening - Comments:: No issues Vision Screening - Comments:: readers  Dietary issues and exercise activities discussed:     Goals Addressed             This Visit's Progress    Patient Stated       Keep weight manageable       Depression Screen    02/25/2023   10:27 AM 01/12/2023    8:28 AM 01/06/2022    9:43 AM 11/30/2020    8:03 AM 08/08/2019    1:15 PM 08/06/2018    1:15 PM 08/05/2017    2:07 PM  PHQ 2/9 Scores  PHQ - 2 Score 0 0 0 0 0 0 0  PHQ- 9 Score 0 0         Fall Risk    02/25/2023   10:22 AM 01/12/2023    8:28 AM 01/06/2022    9:43 AM 11/30/2020    8:03 AM 08/08/2019    1:15 PM  Fall Risk   Falls in the past year? 1 0 0 0 0  Number falls in past yr: 0 0 0 0 0  Injury with Fall? 0 0 0    Risk for fall due to : History of fall(s) No Fall Risks No Fall Risks    Risk for fall due to: Comment On a bike ride      Follow up Falls prevention  discussed;Falls evaluation completed Falls evaluation completed Falls evaluation completed Falls evaluation completed Falls evaluation completed    MEDICARE RISK AT HOME:  Medicare Risk at Home - 02/25/23 1023     Any stairs in or around the home? Yes    If so, are there any without handrails? No    Home free of loose throw rugs in walkways, pet beds, electrical cords, etc? Yes    Adequate lighting in your home to reduce risk of falls? Yes    Life alert? No    Use of a cane, walker or w/c? No    Grab bars in the bathroom? No    Shower chair or bench in shower? Yes    Elevated toilet seat or a handicapped toilet? No              Cognitive Function:        02/25/2023   10:32 AM  6CIT Screen  What Year? 0 points  What month? 0 points  What time? 0 points  Count back from 20 0 points  Months in reverse 0 points  Repeat phrase 0 points  Total Score 0 points    Immunizations Immunization History  Administered Date(s) Administered   Influenza-Unspecified 08/06/2018   PFIZER(Purple Top)SARS-COV-2 Vaccination 09/24/2019, 10/19/2019, 04/17/2021   PNEUMOCOCCAL CONJUGATE-20 04/17/2021   Pneumococcal Conjugate-13 07/23/2016, 08/06/2018   Pneumococcal Polysaccharide-23 09/05/2019   Tdap 08/06/2018    TDAP status: Up to date  Flu Vaccine status: Due, Education has been provided regarding the importance of this vaccine. Advised may receive this vaccine at local pharmacy or Health Dept. Aware to provide a copy of the vaccination record  if obtained from local pharmacy or Health Dept. Verbalized acceptance and understanding.  Pneumococcal vaccine status: Up to date  Covid-19 vaccine status: Completed vaccines  Qualifies for Shingles Vaccine? Yes   Zostavax completed Yes   Shingrix Completed?: Yes Patient is sure that he had them and will confirm and contact the office  Screening Tests Health Maintenance  Topic Date Due   Medicare Annual Wellness (AWV)  Never done    Zoster Vaccines- Shingrix (1 of 2) Never done   COVID-19 Vaccine (4 - 2023-24 season) 04/04/2022   INFLUENZA VACCINE  03/05/2023   Colonoscopy  08/24/2023   DTaP/Tdap/Td (2 - Td or Tdap) 08/06/2028   Pneumonia Vaccine 21+ Years old  Completed   Hepatitis C Screening  Completed   HPV VACCINES  Aged Out    Health Maintenance  Health Maintenance Due  Topic Date Due   Medicare Annual Wellness (AWV)  Never done   Zoster Vaccines- Shingrix (1 of 2) Never done   COVID-19 Vaccine (4 - 2023-24 season) 04/04/2022    Colorectal cancer screening: Type of screening: Colonoscopy. Completed 1/20. Repeat every 5 years  Lung Cancer Screening: (Low Dose CT Chest recommended if Age 87-80 years, 20 pack-year currently smoking OR have quit w/in 15years.) does not qualify.    Additional Screening:  Hepatitis C Screening: does not qualify; Completed 1/19  Vision Screening: Recommended annual ophthalmology exams for early detection of glaucoma and other disorders of the eye. Is the patient up to date with their annual eye exam?  Yes  Who is the provider or what is the name of the office in which the patient attends annual eye exams? [facility] If pt is not established with a provider, would they like to be referred to a provider to establish care? No .   Dental Screening: Recommended annual dental exams for proper oral hygiene    Community Resource Referral / Chronic Care Management: CRR required this visit?  No   CCM required this visit?  No    Plan:     I have personally reviewed and noted the following in the patient's chart:   Medical and social history Use of alcohol, tobacco or illicit drugs  Current medications and supplements including opioid prescriptions. Patient is not currently taking opioid prescriptions. Functional ability and status Nutritional status Physical activity Advanced directives List of other physicians Hospitalizations, surgeries, and ER visits in previous  12 months Vitals Screenings to include cognitive, depression, and falls Referrals and appointments  In addition, I have reviewed and discussed with patient certain preventive protocols, quality metrics, and best practice recommendations. A written personalized care plan for preventive services as well as general preventive health recommendations were provided to patient.     Sydell Axon, LPN   11/15/2438   After Visit Summary: (MyChart) Due to this being a telephonic visit, the after visit summary with patients personalized plan was offered to patient via MyChart   Nurse Notes: None

## 2023-02-26 ENCOUNTER — Ambulatory Visit (INDEPENDENT_AMBULATORY_CARE_PROVIDER_SITE_OTHER): Payer: Medicare Other | Admitting: Nurse Practitioner

## 2023-02-26 ENCOUNTER — Encounter: Payer: Self-pay | Admitting: Nurse Practitioner

## 2023-02-26 ENCOUNTER — Ambulatory Visit (INDEPENDENT_AMBULATORY_CARE_PROVIDER_SITE_OTHER): Payer: Medicare Other

## 2023-02-26 VITALS — BP 118/86 | HR 67 | Temp 97.4°F | Ht 71.0 in | Wt 195.4 lb

## 2023-02-26 DIAGNOSIS — M25562 Pain in left knee: Secondary | ICD-10-CM

## 2023-02-26 DIAGNOSIS — M1712 Unilateral primary osteoarthritis, left knee: Secondary | ICD-10-CM | POA: Diagnosis not present

## 2023-02-26 MED ORDER — MELOXICAM 7.5 MG PO TABS: 7.5000 mg | ORAL_TABLET | Freq: Every day | ORAL | 0 refills | Status: DC

## 2023-02-26 NOTE — Progress Notes (Signed)
Established Patient Office Visit  Subjective:  Patient ID: Jason Hayes, male    DOB: 08-Aug-1950  Age: 72 y.o. MRN: [id number]  CC:  Chief Complaint  Patient presents with   left knee pain    HPI  Jason Hayes presents for:  Knee Pain  The incident occurred more than 1 week ago. The incident occurred at home. There was no injury mechanism. The pain is present in the left knee. The quality of the pain is described as aching. The pain is at a severity of 4/10. The pain is mild. The pain has been Fluctuating since onset. Pertinent negatives include no numbness. Associated symptoms comments: Walking increases the pain . He reports no foreign bodies present. He has tried NSAIDs for the symptoms. The treatment provided no relief.     Past Medical History:  Diagnosis Date   Asthma    childhood   Colon polyps    Hyperlipidemia    Kidney stones     Past Surgical History:  Procedure Laterality Date   CATARACT EXTRACTION W/PHACO Right 10/16/2020   Procedure: CATARACT EXTRACTION PHACO AND INTRAOCULAR LENS PLACEMENT (IOC) RIGHT 5.67 00:36.1;  Surgeon: Galen Manila, MD;  Location: [facility];  Service: Ophthalmology;  Laterality: Right;   CATARACT EXTRACTION W/PHACO Left 10/30/2020   Procedure: CATARACT EXTRACTION PHACO AND INTRAOCULAR LENS PLACEMENT (IOC) LEFT;  Surgeon: Galen Manila, MD;  Location: [facility];  Service: Ophthalmology;  Laterality: Left;  5.89 0:31.4   COLONOSCOPY WITH PROPOFOL N/A 08/23/2018   Procedure: COLONOSCOPY WITH PROPOFOL;  Surgeon: Wyline Mood, MD;  Location: [facility] END[facility];  Service: Gastroenterology;  Laterality: N/A;   EYE SURGERY     FOOT SURGERY     HERNIA REPAIR     KNEE SURGERY      Family History  Problem Relation Age of Onset   Hyperlipidemia Mother    Hypertension Mother    Diabetes Mother    Heart disease Mother    Hypertension Father    Hyperlipidemia Father    Diabetes Father    Heart disease Father     Hyperlipidemia Sister    Hypertension Sister    Diabetes Sister    Cancer Sister    Ovarian cancer Sister    Hypertension Brother    Hyperlipidemia Brother    Diabetes Brother    Prostate cancer Brother    Prostate cancer Brother    Stroke Sister     Social History   Socioeconomic History   Marital status: Married    Spouse name: Not on file   Number of children: Not on file   Years of education: Not on file   Highest education level: Not on file  Occupational History   Not on file  Tobacco Use   Smoking status: Never   Smokeless tobacco: Never  Vaping Use   Vaping status: Never Used  Substance and Sexual Activity   Alcohol use: No    Alcohol/week: 0.0 standard drinks of alcohol   Drug use: No   Sexual activity: Yes    Partners: Female  Other Topics Concern   Not on file  Social History Narrative   Not on file   Social Determinants of Health   Financial Resource Strain: Low Risk  (02/25/2023)   Overall Financial Resource Strain (CARDIA)    Difficulty of Paying Living Expenses: Not hard at all  Food Insecurity: No Food Insecurity (02/25/2023)   Hunger Vital Sign    Worried About Programme researcher, broadcasting/film/video in  the Last Year: Never true    Ran Out of Food in the Last Year: Never true  Transportation Needs: No Transportation Needs (02/25/2023)   PRAPARE - Administrator, Civil Service (Medical): No    Lack of Transportation (Non-Medical): No  Physical Activity: Sufficiently Active (02/25/2023)   Exercise Vital Sign    Days of Exercise per Week: 4 days    Minutes of Exercise per Session: 90 min  Stress: No Stress Concern Present (02/25/2023)   [employer] of Occupational Health - Occupational Stress Questionnaire    Feeling of Stress : Not at all  Social Connections: Moderately Integrated (02/25/2023)   Social Connection and Isolation Panel [NHANES]    Frequency of Communication with Friends and Family: More than three times a week    Frequency of Social  Gatherings with Friends and Family: More than three times a week    Attends Religious Services: More than 4 times per year    Active Member of [employer] or Organizations: No    Attends Banker Meetings: Never    Marital Status: Married  Catering manager Violence: Not At Risk (02/25/2023)   Humiliation, Afraid, Rape, and Kick questionnaire    Fear of Current or Ex-Partner: No    Emotionally Abused: No    Physically Abused: No    Sexually Abused: No     Outpatient Medications Prior to Visit  Medication Sig Dispense Refill   atorvastatin (LIPITOR) 20 MG tablet TAKE 1 TABLET BY MOUTH AT  BEDTIME 100 tablet 2   ibuprofen (ADVIL) 200 MG tablet Take 200 mg by mouth every 6 (six) hours as needed.     No facility-administered medications prior to visit.    No Known Allergies  ROS Review of Systems  Neurological:  Negative for numbness.   Negative unless indicated in HPI.    Objective:    Physical Exam Constitutional:      Appearance: Normal appearance.  Cardiovascular:     Rate and Rhythm: Normal rate and regular rhythm.     Pulses: Normal pulses.     Heart sounds: Normal heart sounds.  Pulmonary:     Effort: Pulmonary effort is normal.     Breath sounds: No stridor.  Musculoskeletal:        General: No swelling.     Cervical back: Normal range of motion.     Right lower leg: No edema.     Left lower leg: No edema.  Neurological:     General: No focal deficit present.     Mental Status: He is alert. Mental status is at baseline.  Psychiatric:        Mood and Affect: Mood normal.        Behavior: Behavior normal.        Thought Content: Thought content normal.        Judgment: Judgment normal.     BP 118/86   Pulse 67   Temp (!) 97.4 F (36.3 C) (Oral)   Ht 5\' 11"  (1.803 m)   Wt 195 lb 6.4 oz (88.6 kg)   SpO2 98%   BMI 27.25 kg/m  Wt Readings from Last 3 Encounters:  02/26/23 195 lb 6.4 oz (88.6 kg)  02/25/23 195 lb (88.5 kg)  01/12/23 188 lb 12.8  oz (85.6 kg)     Health Maintenance  Topic Date Due   COVID-19 Vaccine (4 - 2023-24 season) 04/04/2022   INFLUENZA VACCINE  03/05/2023   Colonoscopy  08/24/2023  Medicare Annual Wellness (AWV)  02/25/2024   DTaP/Tdap/Td (2 - Td or Tdap) 08/06/2028   Pneumonia Vaccine 31+ Years old  Completed   Hepatitis C Screening  Completed   Zoster Vaccines- Shingrix  Completed   HPV VACCINES  Aged Out    There are no preventive care reminders to display for this patient.  Lab Results  Component Value Date   TSH 1.01 08/06/2018   Lab Results  Component Value Date   WBC 5.2 07/23/2016   HGB 13.1 07/23/2016   HCT 40.5 07/23/2016   MCV 84.1 07/23/2016   PLT 158.0 07/23/2016   Lab Results  Component Value Date   NA 140 01/12/2023   K 4.2 01/12/2023   CO2 29 01/12/2023   GLUCOSE 99 01/12/2023   BUN 17 01/12/2023   CREATININE 1.51 (H) 01/12/2023   BILITOT 1.2 01/12/2023   ALKPHOS 58 01/12/2023   AST 26 01/12/2023   ALT 20 01/12/2023   PROT 7.1 01/12/2023   ALBUMIN 4.4 01/12/2023   CALCIUM 9.9 01/12/2023   GFR 45.81 (L) 01/12/2023   Lab Results  Component Value Date   CHOL 160 01/12/2023   Lab Results  Component Value Date   HDL 63.50 01/12/2023   Lab Results  Component Value Date   LDLCALC 86 01/12/2023   Lab Results  Component Value Date   TRIG 55.0 01/12/2023   Lab Results  Component Value Date   CHOLHDL 3 01/12/2023   Lab Results  Component Value Date   HGBA1C 5.8 01/12/2023      Assessment & Plan:  Acute pain of left knee Assessment & Plan: No visible swelling or redness. X-ray of the left knee pending. Will treat with meloxicam, alternate hot and cold pack. If symptoms not improving will refer to Ortho/PT  Orders: -     DG Knee Complete 4 Views Left  Other orders -     Meloxicam; Take 1 tablet (7.5 mg total) by mouth daily.  Dispense: 30 tablet; Refill: 0    Follow-up: Return if symptoms worsen or fail to improve.   Kara Dies, NP

## 2023-02-26 NOTE — Patient Instructions (Addendum)
Please go to the lab for Xrays. Have sent Meloxicam for pain. Alternate hot and cold pack to the knee.

## 2023-02-27 NOTE — Progress Notes (Signed)
Subjective:   Jason Hayes is a 72 y.o. male who presents for an Initial Medicare Annual Wellness Visit.  Visit Complete: Virtual  I connected with  Jason Hayes on 02/27/23 by a audio enabled telemedicine application and verified that I am speaking with the correct person using two identifiers.  Patient Location: Home  Provider Location: Office/Clinic  I discussed the limitations of evaluation and management by telemedicine. The patient expressed understanding and agreed to proceed.   Review of Systems    Vital Signs: Unable to obtain new vitals due to this being a telehealth visit.   Cardiac Risk Factors include: advanced age (>32men, >5 women);dyslipidemia;male gender     Objective:    Today's Vitals   02/25/23 1016 02/25/23 1017  Weight: 195 lb (88.5 kg)   Height: 5\' 11"  (1.803 m)   PainSc:  3    Body mass index is 27.2 kg/m.     02/25/2023   10:31 AM 10/30/2020    9:26 AM 10/16/2020    7:35 AM 08/23/2018    8:26 AM 06/20/2015    8:52 AM  Advanced Directives  Does Patient Have a Medical Advance Directive? Yes Yes Yes Yes Yes  Type of Estate agent of [place];Living will Healthcare Power of [place];Living will Healthcare Power of [place];Living will Living will Healthcare Power of [place];Living will  Does patient want to make changes to medical advance directive?  No - Patient declined No - Patient declined    Copy of Healthcare Power of Attorney in Chart? No - copy requested No - copy requested No - copy requested      Current Medications (verified) Outpatient Encounter Medications as of 02/25/2023  Medication Sig   atorvastatin (LIPITOR) 20 MG tablet TAKE 1 TABLET BY MOUTH AT  BEDTIME   ibuprofen (ADVIL) 200 MG tablet Take 200 mg by mouth every 6 (six) hours as needed.   No facility-administered encounter medications on file as of 02/25/2023.    Allergies (verified) Patient has no known allergies.   History: Past Medical  History:  Diagnosis Date   Asthma    childhood   Colon polyps    Hyperlipidemia    Kidney stones    Past Surgical History:  Procedure Laterality Date   CATARACT EXTRACTION W/PHACO Right 10/16/2020   Procedure: CATARACT EXTRACTION PHACO AND INTRAOCULAR LENS PLACEMENT (IOC) RIGHT 5.67 00:36.1;  Surgeon: Galen Manila, MD;  Location: [facility];  Service: Ophthalmology;  Laterality: Right;   CATARACT EXTRACTION W/PHACO Left 10/30/2020   Procedure: CATARACT EXTRACTION PHACO AND INTRAOCULAR LENS PLACEMENT (IOC) LEFT;  Surgeon: Galen Manila, MD;  Location: [facility];  Service: Ophthalmology;  Laterality: Left;  5.89 0:31.4   COLONOSCOPY WITH PROPOFOL N/A 08/23/2018   Procedure: COLONOSCOPY WITH PROPOFOL;  Surgeon: Wyline Mood, MD;  Location: [facility];  Service: Gastroenterology;  Laterality: N/A;   EYE SURGERY     FOOT SURGERY     HERNIA REPAIR     KNEE SURGERY     Family History  Problem Relation Age of Onset   Hyperlipidemia Mother    Hypertension Mother    Diabetes Mother    Heart disease Mother    Hypertension Father    Hyperlipidemia Father    Diabetes Father    Heart disease Father    Hyperlipidemia Sister    Hypertension Sister    Diabetes Sister    Cancer Sister    Ovarian cancer Sister    Hypertension Brother    Hyperlipidemia Brother  Diabetes Brother    Prostate cancer Brother    Prostate cancer Brother    Stroke Sister    Social History   Socioeconomic History   Marital status: Married    Spouse name: Not on file   Number of children: Not on file   Years of education: Not on file   Highest education level: Not on file  Occupational History   Not on file  Tobacco Use   Smoking status: Never   Smokeless tobacco: Never  Vaping Use   Vaping status: Never Used  Substance and Sexual Activity   Alcohol use: No    Alcohol/week: 0.0 standard drinks of alcohol   Drug use: No   Sexual activity: Yes    Partners: Female  Other  Topics Concern   Not on file  Social History Narrative   Not on file   Social Determinants of Health   Financial Resource Strain: Low Risk  (02/25/2023)   Overall Financial Resource Strain (CARDIA)    Difficulty of Paying Living Expenses: Not hard at all  Food Insecurity: No Food Insecurity (02/25/2023)   Hunger Vital Sign    Worried About Running Out of Food in the Last Year: Never true    Ran Out of Food in the Last Year: Never true  Transportation Needs: No Transportation Needs (02/25/2023)   PRAPARE - Administrator, Civil Service (Medical): No    Lack of Transportation (Non-Medical): No  Physical Activity: Sufficiently Active (02/25/2023)   Exercise Vital Sign    Days of Exercise per Week: 4 days    Minutes of Exercise per Session: 90 min  Stress: No Stress Concern Present (02/25/2023)   [employer] of Occupational Health - Occupational Stress Questionnaire    Feeling of Stress : Not at all  Social Connections: Moderately Integrated (02/25/2023)   Social Connection and Isolation Panel [NHANES]    Frequency of Communication with Friends and Family: More than three times a week    Frequency of Social Gatherings with Friends and Family: More than three times a week    Attends Religious Services: More than 4 times per year    Active Member of [employer] or Organizations: No    Attends Engineer, structural: Never    Marital Status: Married    Tobacco Counseling Counseling given: Not Answered   Clinical Intake:  Pre-visit preparation completed: Yes  Pain : 0-10 Pain Score: 3  Pain Type: Acute pain Pain Location: Knee Pain Orientation: Left Pain Descriptors / Indicators: Aching Pain Onset: 1 to 4 weeks ago Pain Frequency: Intermittent Pain Relieving Factors: Advil as needed  Pain Relieving Factors: Advil as needed  BMI - recorded: 27.2 Nutritional Status: BMI 25 -29 Overweight Nutritional Risks: None Diabetes: No  How often do you need to have  someone help you when you read instructions, pamphlets, or other written materials from your doctor or pharmacy?: 1 - Never  Interpreter Needed?: No  Information entered by :: R. Goldie Tregoning LPN   Activities of Daily Living    02/25/2023   10:20 AM  In your present state of health, do you have any difficulty performing the following activities:  Hearing? 0  Vision? 0  Comment readers  Difficulty concentrating or making decisions? 0  Walking or climbing stairs? 0  Dressing or bathing? 0  Doing errands, shopping? 0  Preparing Food and eating ? N  Using the Toilet? N  In the past six months, have you accidently leaked urine? N  Do you have problems with loss of bowel control? N  Managing your Medications? N  Managing your Finances? N  Housekeeping or managing your Housekeeping? N    Patient Care Team: Glori Luis, MD as PCP - General (Family Medicine) Wyline Mood, MD as Consulting Physician (Gastroenterology) Riki Altes, MD (Urology)  Indicate any recent Medical Services you may have received from other than [facility] providers in the past year (date may be approximate).     Assessment:   This is a routine wellness examination for [place].  Hearing/Vision screen Hearing Screening - Comments:: No issues Vision Screening - Comments:: readers  Dietary issues and exercise activities discussed:     Goals Addressed             This Visit's Progress    Patient Stated       Keep weight manageable       Depression Screen    02/26/2023    3:10 PM 02/25/2023   10:27 AM 01/12/2023    8:28 AM 01/06/2022    9:43 AM 11/30/2020    8:03 AM 08/08/2019    1:15 PM 08/06/2018    1:15 PM  PHQ 2/9 Scores  PHQ - 2 Score 0 0 0 0 0 0 0  PHQ- 9 Score 0 0 0        Fall Risk    02/26/2023    3:10 PM 02/25/2023   10:22 AM 01/12/2023    8:28 AM 01/06/2022    9:43 AM 11/30/2020    8:03 AM  Fall Risk   Falls in the past year? 0 1 0 0 0  Number falls in past yr: 0 0 0 0 0  Injury with  Fall? 0 0 0 0   Risk for fall due to : No Fall Risks History of fall(s) No Fall Risks No Fall Risks   Risk for fall due to: Comment  On a bike ride     Follow up Falls evaluation completed Falls prevention discussed;Falls evaluation completed Falls evaluation completed Falls evaluation completed Falls evaluation completed    MEDICARE RISK AT HOME:     Cognitive Function:        02/25/2023   10:32 AM  6CIT Screen  What Year? 0 points  What month? 0 points  What time? 0 points  Count back from 20 0 points  Months in reverse 0 points  Repeat phrase 0 points  Total Score 0 points    Immunizations Immunization History  Administered Date(s) Administered   Influenza-Unspecified 08/06/2018   PFIZER(Purple Top)SARS-COV-2 Vaccination 09/24/2019, 10/19/2019, 04/17/2021   PNEUMOCOCCAL CONJUGATE-20 04/17/2021   Pneumococcal Conjugate-13 07/23/2016, 08/06/2018   Pneumococcal Polysaccharide-23 09/05/2019   Tdap 08/06/2018    TDAP status: Up to date  Flu Vaccine status: Due, Education has been provided regarding the importance of this vaccine. Advised may receive this vaccine at local pharmacy or Health Dept. Aware to provide a copy of the vaccination record if obtained from local pharmacy or Health Dept. Verbalized acceptance and understanding.  Pneumococcal vaccine status: Up to date  Covid-19 vaccine status: Completed vaccines  Qualifies for Shingles Vaccine? Yes   Zostavax completed Yes   Shingrix Completed?: Yes Patient is sure that he had them and will confirm and contact the office  Screening Tests Health Maintenance  Topic Date Due   Zoster Vaccines- Shingrix (1 of 2) Never done   COVID-19 Vaccine (4 - 2023-24 season) 04/04/2022   INFLUENZA VACCINE  03/05/2023   Colonoscopy  08/24/2023   Medicare Annual Wellness (AWV)  02/25/2024   DTaP/Tdap/Td (2 - Td or Tdap) 08/06/2028   Pneumonia Vaccine 38+ Years old  Completed   Hepatitis C Screening  Completed   HPV VACCINES   Aged Out    Health Maintenance  Health Maintenance Due  Topic Date Due   Zoster Vaccines- Shingrix (1 of 2) Never done   COVID-19 Vaccine (4 - 2023-24 season) 04/04/2022    Colorectal cancer screening: Type of screening: Colonoscopy. Completed 1/20. Repeat every 5 years  Lung Cancer Screening: (Low Dose CT Chest recommended if Age 40-80 years, 20 pack-year currently smoking OR have quit w/in 15years.) does not qualify.    Additional Screening:  Hepatitis C Screening: does not qualify; Completed 1/19  Vision Screening: Recommended annual ophthalmology exams for early detection of glaucoma and other disorders of the eye. Is the patient up to date with their annual eye exam?  Yes  Who is the provider or what is the name of the office in which the patient attends annual eye exams? [facility] If pt is not established with a provider, would they like to be referred to a provider to establish care? No .   Dental Screening: Recommended annual dental exams for proper oral hygiene    Community Resource Referral / Chronic Care Management: CRR required this visit?  No   CCM required this visit?  No    Plan:     I have personally reviewed and noted the following in the patient's chart:   Medical and social history Use of alcohol, tobacco or illicit drugs  Current medications and supplements including opioid prescriptions. Patient is not currently taking opioid prescriptions. Functional ability and status Nutritional status Physical activity Advanced directives List of other physicians Hospitalizations, surgeries, and ER visits in previous 12 months Vitals Screenings to include cognitive, depression, and falls Referrals and appointments  In addition, I have reviewed and discussed with patient certain preventive protocols, quality metrics, and best practice recommendations. A written personalized care plan for preventive services as well as general preventive health  recommendations were provided to patient.     Sydell Axon, LPN   6/44/0347   After Visit Summary: (MyChart) Due to this being a telephonic visit, the after visit summary with patients personalized plan was offered to patient via MyChart   Nurse Notes: None

## 2023-03-09 DIAGNOSIS — M25562 Pain in left knee: Secondary | ICD-10-CM | POA: Insufficient documentation

## 2023-03-09 NOTE — Assessment & Plan Note (Addendum)
No visible swelling or redness. X-ray of the left knee pending. Will treat with meloxicam, alternate hot and cold pack. If symptoms not improving will refer to Ortho/PT

## 2023-03-22 ENCOUNTER — Other Ambulatory Visit: Payer: Self-pay | Admitting: Nurse Practitioner

## 2023-04-15 DIAGNOSIS — T1502XA Foreign body in cornea, left eye, initial encounter: Secondary | ICD-10-CM | POA: Diagnosis not present

## 2023-06-05 DIAGNOSIS — H40003 Preglaucoma, unspecified, bilateral: Secondary | ICD-10-CM | POA: Diagnosis not present

## 2023-06-05 DIAGNOSIS — H26492 Other secondary cataract, left eye: Secondary | ICD-10-CM | POA: Diagnosis not present

## 2023-08-28 ENCOUNTER — Other Ambulatory Visit: Payer: Self-pay | Admitting: Family Medicine

## 2023-08-28 DIAGNOSIS — E785 Hyperlipidemia, unspecified: Secondary | ICD-10-CM

## 2023-10-27 ENCOUNTER — Telehealth: Payer: Self-pay | Admitting: Family Medicine

## 2023-10-27 NOTE — Telephone Encounter (Signed)
 Dr Clent Ridges is leaving the practice and your New Patient or Transfer of Care appointment needs to be rescheduled with another provider. Please call the office to schedule a Transfer of Care to either Dr Charlann Lange, Darleen Crocker or Kara Dies, NP.   Thank you  E2C2, please reschedule this patient's TOC visit. [facility]

## 2023-12-21 DIAGNOSIS — M216X1 Other acquired deformities of right foot: Secondary | ICD-10-CM | POA: Diagnosis not present

## 2023-12-21 DIAGNOSIS — M79671 Pain in right foot: Secondary | ICD-10-CM | POA: Diagnosis not present

## 2023-12-21 DIAGNOSIS — M7731 Calcaneal spur, right foot: Secondary | ICD-10-CM | POA: Diagnosis not present

## 2023-12-21 DIAGNOSIS — M722 Plantar fascial fibromatosis: Secondary | ICD-10-CM | POA: Diagnosis not present

## 2023-12-21 DIAGNOSIS — M216X2 Other acquired deformities of left foot: Secondary | ICD-10-CM | POA: Diagnosis not present

## 2024-01-11 DIAGNOSIS — M79671 Pain in right foot: Secondary | ICD-10-CM | POA: Diagnosis not present

## 2024-01-11 DIAGNOSIS — M722 Plantar fascial fibromatosis: Secondary | ICD-10-CM | POA: Diagnosis not present

## 2024-01-11 DIAGNOSIS — M216X2 Other acquired deformities of left foot: Secondary | ICD-10-CM | POA: Diagnosis not present

## 2024-01-11 DIAGNOSIS — M216X1 Other acquired deformities of right foot: Secondary | ICD-10-CM | POA: Diagnosis not present

## 2024-01-11 DIAGNOSIS — M7731 Calcaneal spur, right foot: Secondary | ICD-10-CM | POA: Diagnosis not present

## 2024-01-15 ENCOUNTER — Encounter: Payer: Medicare Other | Admitting: Family Medicine

## 2024-01-15 ENCOUNTER — Encounter: Payer: BC Managed Care – PPO | Admitting: Family Medicine

## 2024-02-29 ENCOUNTER — Ambulatory Visit (INDEPENDENT_AMBULATORY_CARE_PROVIDER_SITE_OTHER): Payer: BC Managed Care – PPO | Admitting: *Deleted

## 2024-02-29 VITALS — Ht 71.0 in | Wt 195.0 lb

## 2024-02-29 DIAGNOSIS — Z0000 Encounter for general adult medical examination without abnormal findings: Secondary | ICD-10-CM

## 2024-02-29 NOTE — Patient Instructions (Signed)
 Mr. Jason Hayes , Thank you for taking time out of your busy schedule to complete your Annual Wellness Visit with me. I enjoyed our conversation and look forward to speaking with you again next year. I, as well as your care team,  appreciate your ongoing commitment to your health goals. Please review the following plan we discussed and let me know if I can assist you in the future. Your Game plan/ To Do List    Referrals: If you haven't heard from the office you've been referred to, please reach out to them at the phone provided.  Remember to call your GI doctor to discuss a follow-up colonoscopy.  Make sure that you update your flu and covid vaccines annually. Follow up Visits: Next Medicare AWV with our clinical staff: 03/03/25 @ 10:10   Have you seen your provider in the last 6 months (3 months if uncontrolled diabetes)? Yes Next Office Visit with your provider: 04/15/24  Clinician Recommendations:  Aim for 30 minutes of exercise or brisk walking, 6-8 glasses of water, and 5 servings of fruits and vegetables each day.       This is a list of the screening recommended for you and due dates:  Health Maintenance  Topic Date Due   COVID-19 Vaccine (5 - 2024-25 season) 04/05/2023   Colon Cancer Screening  08/24/2023   Flu Shot  03/04/2024   Medicare Annual Wellness Visit  02/28/2025   DTaP/Tdap/Td vaccine (2 - Td or Tdap) 08/06/2028   Pneumococcal Vaccine for age over 39  Completed   Hepatitis C Screening  Completed   Zoster (Shingles) Vaccine  Completed   Hepatitis B Vaccine  Aged Out   HPV Vaccine  Aged Out   Meningitis B Vaccine  Aged Out    Advanced directives: (Copy Requested) Please bring a copy of your health care power of attorney and living will to the office to be added to your chart at your convenience. You can mail to [facility] [address] [place], [place] [address] or email to [email or web address] Advance Care Planning is important because it:  [x]   Makes sure you receive the medical care that is consistent with your values, goals, and preferences  [x]  It provides guidance to your family and loved ones and reduces their decisional burden about whether or not they are making the right decisions based on your wishes.

## 2024-02-29 NOTE — Progress Notes (Signed)
 Subjective:   Jason Hayes is a 73 y.o. who presents for a Medicare Wellness preventive visit.  As a reminder, Annual Wellness Visits don't include a physical exam, and some assessments may be limited, especially if this visit is performed virtually. We may recommend an in-person follow-up visit with your provider if needed.  Visit Complete: Virtual I connected with  Tiger Boster on 02/29/24 by a audio enabled telemedicine application and verified that I am speaking with the correct person using two identifiers.  Patient Location: Home  Provider Location: Home Office  I discussed the limitations of evaluation and management by telemedicine. The patient expressed understanding and agreed to proceed.  Vital Signs: Because this visit was a virtual/telehealth visit, some criteria may be missing or patient reported. Any vitals not documented were not able to be obtained and vitals that have been documented are patient reported.  VideoDeclined- This patient declined Librarian, academic. Therefore the visit was completed with audio only.  Persons Participating in Visit: Patient.  AWV Questionnaire: Yes: Patient Medicare AWV questionnaire was completed by the patient on 02/25/24; I have confirmed that all information answered by patient is correct and no changes since this date.  Cardiac Risk Factors include: advanced age (>46men, >75 women);dyslipidemia;male gender     Objective:    Today's Vitals   02/29/24 1023  Weight: 195 lb (88.5 kg)  Height: 5' 11 (1.803 m)   Body mass index is 27.2 kg/m.     02/29/2024   10:32 AM 02/25/2023   10:31 AM 10/30/2020    9:26 AM 10/16/2020    7:35 AM 08/23/2018    8:26 AM 06/20/2015    8:52 AM  Advanced Directives  Does Patient Have a Medical Advance Directive? Yes Yes Yes Yes Yes  Yes   Type of Estate agent of [place];Living will Healthcare Power of [place];Living will Healthcare Power of  [place];Living will Healthcare Power of [place];Living will Living will Healthcare Power of [place];Living will   Does patient want to make changes to medical advance directive?   No - Patient declined No - Patient declined    Copy of Healthcare Power of Attorney in Chart? No - copy requested No - copy requested No - copy requested No - copy requested       Data saved with a previous flowsheet row definition    Current Medications (verified) Outpatient Encounter Medications as of 02/29/2024  Medication Sig   atorvastatin  (LIPITOR) 20 MG tablet TAKE 1 TABLET BY MOUTH AT  BEDTIME   ibuprofen (ADVIL) 200 MG tablet Take 200 mg by mouth every 6 (six) hours as needed.   meloxicam  (MOBIC ) 7.5 MG tablet Take 1 tablet by mouth once daily (Patient not taking: Reported on 02/29/2024)   No facility-administered encounter medications on file as of 02/29/2024.    Allergies (verified) Patient has no known allergies.   History: Past Medical History:  Diagnosis Date   Asthma    childhood   Colon polyps    Hyperlipidemia    Kidney stones    Past Surgical History:  Procedure Laterality Date   CATARACT EXTRACTION W/PHACO Right 10/16/2020   Procedure: CATARACT EXTRACTION PHACO AND INTRAOCULAR LENS PLACEMENT (IOC) RIGHT 5.67 00:36.1;  Surgeon: Jaye Fallow, MD;  Location: [facility];  Service: Ophthalmology;  Laterality: Right;   CATARACT EXTRACTION W/PHACO Left 10/30/2020   Procedure: CATARACT EXTRACTION PHACO AND INTRAOCULAR LENS PLACEMENT (IOC) LEFT;  Surgeon: Jaye Fallow, MD;  Location: [facility];  Service: Ophthalmology;  Laterality: Left;  5.89 0:31.4   COLONOSCOPY WITH PROPOFOL  N/A 08/23/2018   Procedure: COLONOSCOPY WITH PROPOFOL ;  Surgeon: Therisa Bi, MD;  Location: [facility];  Service: Gastroenterology;  Laterality: N/A;   EYE SURGERY     FOOT SURGERY     HERNIA REPAIR     KNEE SURGERY     Family History  Problem Relation Age of Onset   Hyperlipidemia  Mother    Hypertension Mother    Diabetes Mother    Heart disease Mother    Hypertension Father    Hyperlipidemia Father    Diabetes Father    Heart disease Father    Hyperlipidemia Sister    Hypertension Sister    Diabetes Sister    Cancer Sister    Ovarian cancer Sister    Hypertension Brother    Hyperlipidemia Brother    Diabetes Brother    Prostate cancer Brother    Prostate cancer Brother    Stroke Sister    Social History   Socioeconomic History   Marital status: Married    Spouse name: Not on file   Number of children: Not on file   Years of education: Not on file   Highest education level: Some college, no degree  Occupational History   Not on file  Tobacco Use   Smoking status: Never   Smokeless tobacco: Never  Vaping Use   Vaping status: Never Used  Substance and Sexual Activity   Alcohol use: No    Alcohol/week: 0.0 standard drinks of alcohol   Drug use: No   Sexual activity: Yes    Partners: Female  Other Topics Concern   Not on file  Social History Narrative   married   Social Drivers of Corporate investment banker Strain: Low Risk  (02/25/2024)   Overall Financial Resource Strain (CARDIA)    Difficulty of Paying Living Expenses: Not hard at all  Food Insecurity: No Food Insecurity (02/25/2024)   Hunger Vital Sign    Worried About Running Out of Food in the Last Year: Never true    Ran Out of Food in the Last Year: Never true  Transportation Needs: No Transportation Needs (02/25/2024)   PRAPARE - Administrator, Civil Service (Medical): No    Lack of Transportation (Non-Medical): No  Physical Activity: Sufficiently Active (02/25/2024)   Exercise Vital Sign    Days of Exercise per Week: 4 days    Minutes of Exercise per Session: 90 min  Stress: No Stress Concern Present (02/25/2024)   [employer] of Occupational Health - Occupational Stress Questionnaire    Feeling of Stress: Not at all  Social Connections: Moderately  Integrated (02/25/2024)   Social Connection and Isolation Panel    Frequency of Communication with Friends and Family: Once a week    Frequency of Social Gatherings with Friends and Family: Once a week    Attends Religious Services: More than 4 times per year    Active Member of [employer] or Organizations: Yes    Attends Engineer, structural: More than 4 times per year    Marital Status: Married    Tobacco Counseling Counseling given: Not Answered    Clinical Intake:  Pre-visit preparation completed: Yes  Pain : No/denies pain     BMI - recorded: 27.2 Nutritional Status: BMI 25 -29 Overweight Nutritional Risks: None Diabetes: No  Lab Results  Component Value Date   HGBA1C 5.8 01/12/2023   HGBA1C 5.8 01/06/2022  HGBA1C 5.9 01/04/2021     How often do you need to have someone help you when you read instructions, pamphlets, or other written materials from your doctor or pharmacy?: 1 - Never  Interpreter Needed?: No  Information entered by :: R. Estalene Bergey LPN   Activities of Daily Living     02/25/2024   10:19 PM  In your present state of health, do you have any difficulty performing the following activities:  Hearing? 0  Vision? 0  Comment readers  Difficulty concentrating or making decisions? 0  Walking or climbing stairs? 0  Dressing or bathing? 0  Doing errands, shopping? 0  Preparing Food and eating ? N  Using the Toilet? N  In the past six months, have you accidently leaked urine? Y  Do you have problems with loss of bowel control? N  Managing your Medications? N  Managing your Finances? N  Housekeeping or managing your Housekeeping? N    Patient Care Team: Therisa Bi, MD as Consulting Physician (Gastroenterology) Twylla Glendia BROCKS, MD (Urology)  I have updated your Care Teams any recent Medical Services you may have received from other providers in the past year.     Assessment:   This is a routine wellness examination for  [place].  Hearing/Vision screen Hearing Screening - Comments:: No issues Vision Screening - Comments:: Readers    Goals Addressed             This Visit's Progress    Patient Stated       Wants to continue to stay active       Depression Screen     02/29/2024   10:28 AM 02/26/2023    3:10 PM 02/25/2023   10:27 AM 01/12/2023    8:28 AM 01/06/2022    9:43 AM 11/30/2020    8:03 AM 08/08/2019    1:15 PM  PHQ 2/9 Scores  PHQ - 2 Score 0 0 0 0 0 0 0  PHQ- 9 Score 0 0 0 0       Fall Risk     02/25/2024   10:19 PM 02/26/2023    3:10 PM 02/25/2023   10:22 AM 01/12/2023    8:28 AM 01/06/2022    9:43 AM  Fall Risk   Falls in the past year? 0 0 1 0 0  Number falls in past yr: 0 0 0 0 0  Injury with Fall? 0 0 0 0 0  Risk for fall due to : No Fall Risks No Fall Risks History of fall(s) No Fall Risks No Fall Risks  Risk for fall due to: Comment   On a bike ride    Follow up Falls evaluation completed;Falls prevention discussed Falls evaluation completed Falls prevention discussed;Falls evaluation completed Falls evaluation completed Falls evaluation completed      Data saved with a previous flowsheet row definition    MEDICARE RISK AT HOME:  Medicare Risk at Home Any stairs in or around the home?: Yes If so, are there any without handrails?: (Patient-Rptd) No Home free of loose throw rugs in walkways, pet beds, electrical cords, etc?: (Patient-Rptd) Yes Adequate lighting in your home to reduce risk of falls?: (Patient-Rptd) Yes Life alert?: (Patient-Rptd) No Use of a cane, walker or w/c?: (Patient-Rptd) No Grab bars in the bathroom?: (Patient-Rptd) No Shower chair or bench in shower?: (Patient-Rptd) No Elevated toilet seat or a handicapped toilet?: (Patient-Rptd) No  TIMED UP AND GO:  Was the test performed?  No  Cognitive Function: 6CIT  completed        02/29/2024   10:32 AM 02/25/2023   10:32 AM  6CIT Screen  What Year? 0 points 0 points  What month? 0 points 0 points   What time? 0 points 0 points  Count back from 20 0 points 0 points  Months in reverse 0 points 0 points  Repeat phrase 0 points 0 points  Total Score 0 points 0 points    Immunizations Immunization History  Administered Date(s) Administered   Influenza-Unspecified 08/06/2018   PFIZER(Purple Top)SARS-COV-2 Vaccination 09/24/2019, 10/19/2019, 04/17/2021   PNEUMOCOCCAL CONJUGATE-20 04/17/2021   Pneumococcal Conjugate-13 07/23/2016, 08/06/2018   Pneumococcal Polysaccharide-23 09/05/2019   Tdap 08/06/2018   Zoster Recombinant(Shingrix) 01/10/2022, 03/31/2022    Screening Tests Health Maintenance  Topic Date Due   COVID-19 Vaccine (4 - 2024-25 season) 04/05/2023   Colonoscopy  08/24/2023   Medicare Annual Wellness (AWV)  02/25/2024   INFLUENZA VACCINE  03/04/2024   DTaP/Tdap/Td (2 - Td or Tdap) 08/06/2028   Pneumococcal Vaccine: 50+ Years  Completed   Hepatitis C Screening  Completed   Zoster Vaccines- Shingrix  Completed   Hepatitis B Vaccines  Aged Out   HPV VACCINES  Aged Out   Meningococcal B Vaccine  Aged Out    Health Maintenance  Health Maintenance Due  Topic Date Due   COVID-19 Vaccine (4 - 2024-25 season) 04/05/2023   Colonoscopy  08/24/2023   Medicare Annual Wellness (AWV)  02/25/2024   Health Maintenance Items Addressed: Discussed the need to update flu and covid vaccines annually. Patient will reach out to his GI Doctor Ruel Kung at [place] [facility] regarding colonoscopy telephone number provided.  Additional Screening:  Vision Screening: Recommended annual ophthalmology exams for early detection of glaucoma and other disorders of the eye.Up to date [facility] Would you like a referral to an eye doctor? No    Dental Screening: Recommended annual dental exams for proper oral hygiene  Community Resource Referral / Chronic Care Management: CRR required this visit?  No   CCM required this visit?  No   Plan:    I have personally reviewed and noted  the following in the patient's chart:   Medical and social history Use of alcohol, tobacco or illicit drugs  Current medications and supplements including opioid prescriptions. Patient is not currently taking opioid prescriptions. Functional ability and status Nutritional status Physical activity Advanced directives List of other physicians Hospitalizations, surgeries, and ER visits in previous 12 months Vitals Screenings to include cognitive, depression, and falls Referrals and appointments  In addition, I have reviewed and discussed with patient certain preventive protocols, quality metrics, and best practice recommendations. A written personalized care plan for preventive services as well as general preventive health recommendations were provided to patient.   Angeline Fredericks, LPN   [id number]/[id number]   After Visit Summary: (MyChart) Due to this being a telephonic visit, the after visit summary with patients personalized plan was offered to patient via MyChart   Notes: Nothing significant to report at this time.

## 2024-04-09 ENCOUNTER — Other Ambulatory Visit: Payer: Self-pay | Admitting: Medical Genetics

## 2024-04-15 ENCOUNTER — Ambulatory Visit: Admitting: Nurse Practitioner

## 2024-04-15 ENCOUNTER — Encounter: Payer: Self-pay | Admitting: Nurse Practitioner

## 2024-04-15 VITALS — BP 120/72 | HR 52 | Temp 97.5°F | Ht 71.0 in | Wt 195.8 lb

## 2024-04-15 DIAGNOSIS — E785 Hyperlipidemia, unspecified: Secondary | ICD-10-CM | POA: Diagnosis not present

## 2024-04-15 DIAGNOSIS — Z125 Encounter for screening for malignant neoplasm of prostate: Secondary | ICD-10-CM

## 2024-04-15 DIAGNOSIS — K635 Polyp of colon: Secondary | ICD-10-CM

## 2024-04-15 DIAGNOSIS — Z0000 Encounter for general adult medical examination without abnormal findings: Secondary | ICD-10-CM | POA: Diagnosis not present

## 2024-04-15 LAB — LIPID PANEL
Cholesterol: 142 mg/dL (ref 0–200)
HDL: 57.6 mg/dL (ref >39.00)
LDL Cholesterol: 75 mg/dL (ref 0–99)
NonHDL: 83.9
Total CHOL/HDL Ratio: 2
Triglycerides: 47 mg/dL (ref 0.0–149.0)
VLDL: 9.4 mg/dL (ref 0.0–40.0)

## 2024-04-15 LAB — PSA: PSA: 13.56 ng/mL — ABNORMAL HIGH (ref 0.10–4.00)

## 2024-04-15 LAB — COMPREHENSIVE METABOLIC PANEL WITH GFR
ALT: 37 U/L (ref 0–53)
AST: 45 U/L — ABNORMAL HIGH (ref 0–37)
Albumin: 4.4 g/dL (ref 3.5–5.2)
Alkaline Phosphatase: 65 U/L (ref 39–117)
BUN: 18 mg/dL (ref 6–23)
CO2: 31 meq/L (ref 19–32)
Calcium: 10 mg/dL (ref 8.4–10.5)
Chloride: 105 meq/L (ref 96–112)
Creatinine, Ser: 1.36 mg/dL (ref 0.40–1.50)
GFR: 51.48 mL/min — ABNORMAL LOW (ref >60.00)
Glucose, Bld: 95 mg/dL (ref 70–99)
Potassium: 4.3 meq/L (ref 3.5–5.1)
Sodium: 142 meq/L (ref 135–145)
Total Bilirubin: 0.9 mg/dL (ref 0.2–1.2)
Total Protein: 7 g/dL (ref 6.0–8.3)

## 2024-04-15 LAB — HEMOGLOBIN A1C: Hgb A1c MFr Bld: 6.2 % (ref 4.6–6.5)

## 2024-04-15 LAB — TSH: TSH: 1.71 u[IU]/mL (ref 0.35–5.50)

## 2024-04-15 MED ORDER — ATORVASTATIN CALCIUM 20 MG PO TABS: 20.0000 mg | ORAL_TABLET | Freq: Every day | ORAL | 2 refills | Status: AC

## 2024-04-15 NOTE — Progress Notes (Signed)
 Established Patient Office Visit  Subjective:  Patient ID: Jason Hayes, male    DOB: [birth year]  Age: 73 y.o. MRN: [id number]  CC:  Chief Complaint  Patient presents with   Establish Care    Transfer of Care   Discussed the use of a AI scribe software for clinical note transcription with the patient, who gave verbal consent to proceed.  HPI  Jason Hayes presents for TOC and physical.   He has history of hyperlipidemia and is on atorvastatin .   Diet: Patient eat balanced diet rich fruits and veges. Patient eat in the restaurant 2-3 times a week. Eat cereal for breakfast.Patient drinks water, tea and flavored water, Occasionally diet soada.  Exercise: Gym 3 days a week Vaccine  Flu: due, he wants to take at the pharamacy  Tetanus: 2020 COVID: Pfizer Shingles: completed Pnuemonia: completed   Colonoscopy: 08/23/2018 with repeat in 5 year due Prosatate cancer screening: due  Family history:  Colon cancer: no  Prostate cancer: 2 Brothers  Dentist: every 6 months Ophthalmology: regularly  Hep C screening: completed Tobacco use: No Alcohol use:No Illicit drugs: No  HPI   Past Medical History:  Diagnosis Date   Asthma    childhood   Colon polyps    Hyperlipidemia    Kidney stones     Past Surgical History:  Procedure Laterality Date   CATARACT EXTRACTION W/PHACO Right 10/16/2020   Procedure: CATARACT EXTRACTION PHACO AND INTRAOCULAR LENS PLACEMENT (IOC) RIGHT 5.67 00:36.1;  Surgeon: Jaye Fallow, MD;  Location: [facility];  Service: Ophthalmology;  Laterality: Right;   CATARACT EXTRACTION W/PHACO Left 10/30/2020   Procedure: CATARACT EXTRACTION PHACO AND INTRAOCULAR LENS PLACEMENT (IOC) LEFT;  Surgeon: Jaye Fallow, MD;  Location: [facility];  Service: Ophthalmology;  Laterality: Left;  5.89 0:31.4   COLONOSCOPY WITH PROPOFOL  N/A 08/23/2018   Procedure: COLONOSCOPY WITH PROPOFOL ;  Surgeon: Therisa Bi, MD;  Location: [facility];  Service: Gastroenterology;  Laterality: N/A;   EYE SURGERY     FOOT SURGERY     HERNIA REPAIR     KNEE SURGERY      Family History  Problem Relation Age of Onset   Hyperlipidemia Mother    Hypertension Mother    Diabetes Mother    Heart disease Mother    Hypertension Father    Hyperlipidemia Father    Diabetes Father    Heart disease Father    Hyperlipidemia Sister    Hypertension Sister    Diabetes Sister    Cancer Sister    Ovarian cancer Sister    Hypertension Brother    Hyperlipidemia Brother    Diabetes Brother    Prostate cancer Brother    Prostate cancer Brother    Stroke Sister     Social History   Socioeconomic History   Marital status: Married    Spouse name: Not on file   Number of children: Not on file   Years of education: Not on file   Highest education level: Some college, no degree  Occupational History   Not on file  Tobacco Use   Smoking status: Never   Smokeless tobacco: Never  Vaping Use   Vaping status: Never Used  Substance and Sexual Activity   Alcohol use: No    Alcohol/week: 0.0 standard drinks of alcohol   Drug use: No   Sexual activity: Yes    Partners: Female  Other Topics Concern   Not on file  Social History Narrative   married  Social Drivers of Corporate investment banker Strain: Low Risk  (02/25/2024)   Overall Financial Resource Strain (CARDIA)    Difficulty of Paying Living Expenses: Not hard at all  Food Insecurity: No Food Insecurity (02/25/2024)   Hunger Vital Sign    Worried About Running Out of Food in the Last Year: Never true    Ran Out of Food in the Last Year: Never true  Transportation Needs: No Transportation Needs (02/25/2024)   PRAPARE - Administrator, Civil Service (Medical): No    Lack of Transportation (Non-Medical): No  Physical Activity: Sufficiently Active (02/25/2024)   Exercise Vital Sign    Days of Exercise per Week: 4 days    Minutes of Exercise per Session: 90 min   Stress: No Stress Concern Present (02/25/2024)   [employer] of Occupational Health - Occupational Stress Questionnaire    Feeling of Stress: Not at all  Social Connections: Moderately Integrated (02/25/2024)   Social Connection and Isolation Panel    Frequency of Communication with Friends and Family: Once a week    Frequency of Social Gatherings with Friends and Family: Once a week    Attends Religious Services: More than 4 times per year    Active Member of [employer] or Organizations: Yes    Attends Banker Meetings: More than 4 times per year    Marital Status: Married  Catering manager Violence: Not At Risk (02/29/2024)   Humiliation, Afraid, Rape, and Kick questionnaire    Fear of Current or Ex-Partner: No    Emotionally Abused: No    Physically Abused: No    Sexually Abused: No     Outpatient Medications Prior to Visit  Medication Sig Dispense Refill   atorvastatin  (LIPITOR) 20 MG tablet TAKE 1 TABLET BY MOUTH AT  BEDTIME 100 tablet 2   ibuprofen (ADVIL) 200 MG tablet Take 200 mg by mouth every 6 (six) hours as needed.     meloxicam  (MOBIC ) 7.5 MG tablet Take 1 tablet by mouth once daily (Patient not taking: Reported on 02/29/2024) 30 tablet 0   No facility-administered medications prior to visit.    No Known Allergies  ROS Review of Systems Negative unless indicated in HPI.    Objective:    Physical Exam Constitutional:      Appearance: Normal appearance.  HENT:     Mouth/Throat:     Mouth: Mucous membranes are moist.  Eyes:     Conjunctiva/sclera: Conjunctivae normal.     Pupils: Pupils are equal, round, and reactive to light.  Cardiovascular:     Rate and Rhythm: Normal rate and regular rhythm.     Pulses: Normal pulses.     Heart sounds: Normal heart sounds.  Pulmonary:     Effort: Pulmonary effort is normal.     Breath sounds: Normal breath sounds.  Abdominal:     General: Bowel sounds are normal.     Palpations: Abdomen is soft.   Musculoskeletal:     Cervical back: Normal range of motion. No tenderness.  Skin:    General: Skin is warm.     Findings: No bruising.  Neurological:     General: No focal deficit present.     Mental Status: He is alert and oriented to person, place, and time. Mental status is at baseline.  Psychiatric:        Mood and Affect: Mood normal.        Behavior: Behavior normal.  Thought Content: Thought content normal.        Judgment: Judgment normal.     BP 120/72   Pulse (!) 52   Temp (!) 97.5 F (36.4 C)   Ht 5' 11 (1.803 m)   Wt 195 lb 12.8 oz (88.8 kg)   SpO2 99%   BMI 27.31 kg/m  Wt Readings from Last 3 Encounters:  04/15/24 195 lb 12.8 oz (88.8 kg)  02/29/24 195 lb (88.5 kg)  02/26/23 195 lb 6.4 oz (88.6 kg)     Health Maintenance  Topic Date Due   Colonoscopy  08/24/2023   COVID-19 Vaccine (7 - 2025-26 season) 04/04/2024   Influenza Vaccine  11/01/2024 (Originally 03/04/2024)   Medicare Annual Wellness (AWV)  02/28/2025   DTaP/Tdap/Td (2 - Td or Tdap) 08/06/2028   Pneumococcal Vaccine: 50+ Years  Completed   Hepatitis C Screening  Completed   Zoster Vaccines- Shingrix  Completed   HPV VACCINES  Aged Out   Meningococcal B Vaccine  Aged Out    There are no preventive care reminders to display for this patient.  Lab Results  Component Value Date   TSH 1.71 04/15/2024   Lab Results  Component Value Date   WBC 5.2 07/23/2016   HGB 13.1 07/23/2016   HCT 40.5 07/23/2016   MCV 84.1 07/23/2016   PLT 158.0 07/23/2016   Lab Results  Component Value Date   NA 142 04/15/2024   K 4.3 04/15/2024   CO2 31 04/15/2024   GLUCOSE 95 04/15/2024   BUN 18 04/15/2024   CREATININE 1.36 04/15/2024   BILITOT 0.9 04/15/2024   ALKPHOS 65 04/15/2024   AST 45 (H) 04/15/2024   ALT 37 04/15/2024   PROT 7.0 04/15/2024   ALBUMIN 4.4 04/15/2024   CALCIUM  10.0 04/15/2024   GFR 51.48 (L) 04/15/2024   Lab Results  Component Value Date   CHOL 142 04/15/2024   Lab  Results  Component Value Date   HDL 57.60 04/15/2024   Lab Results  Component Value Date   LDLCALC 75 04/15/2024   Lab Results  Component Value Date   TRIG 47.0 04/15/2024   Lab Results  Component Value Date   CHOLHDL 2 04/15/2024   Lab Results  Component Value Date   HGBA1C 6.2 04/15/2024      Assessment & Plan:  Annual physical exam Assessment & Plan: Encouraged patient to consume a balanced diet and regular exercise regimen. Advised to see an eye doctor and dentist annually.  He is due for colonoscopy, referral sent . Will check labs as outlined.    Orders: -     Lipid panel -     PSA -     TSH -     Comprehensive metabolic panel with GFR -     Hemoglobin A1c  Hyperlipidemia, unspecified hyperlipidemia type -     Atorvastatin  Calcium ; Take 1 tablet (20 mg total) by mouth at bedtime.  Dispense: 100 tablet; Refill: 2  Polyp of colon, unspecified part of colon, unspecified type -     Ambulatory referral to Gastroenterology    Follow-up: No follow-ups on file.   Laylonie Marzec, NP

## 2024-04-26 ENCOUNTER — Ambulatory Visit: Payer: Self-pay | Admitting: Nurse Practitioner

## 2024-04-26 NOTE — Progress Notes (Signed)
 Please call and inform pt: PSA is elevated, I would like him to follow up with urology.  Kidney function improved. A1c in prediabetes range and AST elevated. Will repeat liver function in 6 weeks. Lipid and TSH normal.

## 2024-04-27 ENCOUNTER — Other Ambulatory Visit: Payer: Self-pay

## 2024-04-27 DIAGNOSIS — R972 Elevated prostate specific antigen [PSA]: Secondary | ICD-10-CM

## 2024-05-02 DIAGNOSIS — Z0000 Encounter for general adult medical examination without abnormal findings: Secondary | ICD-10-CM | POA: Insufficient documentation

## 2024-05-02 NOTE — Assessment & Plan Note (Signed)
 Encouraged patient to consume a balanced diet and regular exercise regimen. Advised to see an eye doctor and dentist annually.  He is due for colonoscopy, referral sent . Will check labs as outlined.

## 2024-05-24 ENCOUNTER — Other Ambulatory Visit: Payer: Self-pay | Admitting: Medical Genetics

## 2024-05-24 DIAGNOSIS — Z006 Encounter for examination for normal comparison and control in clinical research program: Secondary | ICD-10-CM

## 2024-06-06 ENCOUNTER — Other Ambulatory Visit (INDEPENDENT_AMBULATORY_CARE_PROVIDER_SITE_OTHER)

## 2024-06-06 DIAGNOSIS — R972 Elevated prostate specific antigen [PSA]: Secondary | ICD-10-CM | POA: Diagnosis not present

## 2024-06-06 LAB — HEPATIC FUNCTION PANEL
ALT: 19 U/L (ref 0–53)
AST: 26 U/L (ref 0–37)
Albumin: 4.3 g/dL (ref 3.5–5.2)
Alkaline Phosphatase: 63 U/L (ref 39–117)
Bilirubin, Direct: 0.2 mg/dL (ref 0.0–0.3)
Total Bilirubin: 0.9 mg/dL (ref 0.2–1.2)
Total Protein: 7.1 g/dL (ref 6.0–8.3)

## 2024-06-16 ENCOUNTER — Ambulatory Visit: Payer: Self-pay | Admitting: Nurse Practitioner

## 2024-06-17 LAB — GENECONNECT MOLECULAR SCREEN: Genetic Analysis Overall Interpretation: NEGATIVE

## 2024-08-22 ENCOUNTER — Ambulatory Visit: Admitting: Anesthesiology

## 2024-08-22 ENCOUNTER — Ambulatory Visit
Admission: RE | Admit: 2024-08-22 | Discharge: 2024-08-22 | Disposition: A | Discharge status: Home / Self Care | Attending: Gastroenterology | Admitting: Gastroenterology

## 2024-08-22 ENCOUNTER — Other Ambulatory Visit: Payer: Self-pay

## 2024-08-22 ENCOUNTER — Encounter
Admission: RE | Disposition: A | Discharge status: Home / Self Care | Payer: Self-pay | Source: Home / Self Care | Attending: Gastroenterology

## 2024-08-22 ENCOUNTER — Encounter: Payer: Self-pay | Admitting: Gastroenterology

## 2024-08-22 DIAGNOSIS — Z1211 Encounter for screening for malignant neoplasm of colon: Secondary | ICD-10-CM | POA: Diagnosis present

## 2024-08-22 DIAGNOSIS — J45909 Unspecified asthma, uncomplicated: Secondary | ICD-10-CM | POA: Insufficient documentation

## 2024-08-22 DIAGNOSIS — K621 Rectal polyp: Secondary | ICD-10-CM | POA: Insufficient documentation

## 2024-08-22 DIAGNOSIS — D123 Benign neoplasm of transverse colon: Secondary | ICD-10-CM | POA: Insufficient documentation

## 2024-08-22 HISTORY — PX: POLYPECTOMY: SHX149

## 2024-08-22 HISTORY — PX: COLONOSCOPY: SHX5424

## 2024-08-22 MED ORDER — LIDOCAINE HCL (PF) 2 % IJ SOLN
INTRAMUSCULAR | Status: AC
  Filled 2024-08-22: qty 10

## 2024-08-22 MED ORDER — EPHEDRINE 5 MG/ML INJ
INTRAVENOUS | Status: AC
  Filled 2024-08-22: qty 5

## 2024-08-22 MED ORDER — SODIUM CHLORIDE 0.9 % IV SOLN: INTRAVENOUS | Status: DC

## 2024-08-22 MED ORDER — LIDOCAINE HCL (PF) 2 % IJ SOLN
INTRAMUSCULAR | Status: AC
  Filled 2024-08-22: qty 5

## 2024-08-22 MED ORDER — DEXMEDETOMIDINE HCL IN NACL 80 MCG/20ML IV SOLN
INTRAVENOUS | Status: DC | PRN
  Administered 2024-08-22: 8 ug via INTRAVENOUS
  Administered 2024-08-22: 12 ug via INTRAVENOUS

## 2024-08-22 MED ORDER — PROPOFOL 10 MG/ML IV BOLUS
INTRAVENOUS | Status: DC | PRN
  Administered 2024-08-22 (×2): 50 mg via INTRAVENOUS

## 2024-08-22 MED ORDER — GLYCOPYRROLATE 0.2 MG/ML IJ SOLN
INTRAMUSCULAR | Status: DC | PRN
  Administered 2024-08-22: .2 mg via INTRAVENOUS

## 2024-08-22 MED ORDER — LIDOCAINE HCL (CARDIAC) PF 100 MG/5ML IV SOSY
PREFILLED_SYRINGE | INTRAVENOUS | Status: DC | PRN
  Administered 2024-08-22: 60 mg via INTRAVENOUS

## 2024-08-22 MED ORDER — GLYCOPYRROLATE 0.2 MG/ML IJ SOLN
INTRAMUSCULAR | Status: AC
  Filled 2024-08-22: qty 1

## 2024-08-22 MED ORDER — EPHEDRINE SULFATE-NACL 50-0.9 MG/10ML-% IV SOSY
PREFILLED_SYRINGE | INTRAVENOUS | Status: DC | PRN
  Administered 2024-08-22: 15 mg via INTRAVENOUS
  Administered 2024-08-22: 10 mg via INTRAVENOUS

## 2024-08-22 MED ORDER — PROPOFOL 500 MG/50ML IV EMUL
INTRAVENOUS | Status: DC | PRN
  Administered 2024-08-22: 75 ug/kg/min via INTRAVENOUS

## 2024-08-22 NOTE — Transfer of Care (Signed)
 Immediate Anesthesia Transfer of Care Note  Patient: Jason Hayes  Procedure(s) Performed: COLONOSCOPY POLYPECTOMY, INTESTINE  Patient Location: PACU  Anesthesia Type:General  Level of Consciousness: sedated  Airway & Oxygen Therapy: Patient Spontanous Breathing  Post-op Assessment: Report given to RN and Post -op Vital signs reviewed and stable  Post vital signs: Reviewed and stable  Last Vitals:  Vitals Value Taken Time  BP 99/36 08/22/24 08:58  Temp 35.7 C 08/22/24 08:58  Pulse 62 08/22/24 08:58  Resp 17 08/22/24 08:58  SpO2 100 % 08/22/24 08:58    Last Pain:  Vitals:   08/22/24 0858  TempSrc: Tympanic  PainSc: Asleep         Complications: No notable events documented.

## 2024-08-22 NOTE — Anesthesia Postprocedure Evaluation (Signed)
"   Anesthesia Post Note  Patient: Jason Hayes  Procedure(s) Performed: COLONOSCOPY POLYPECTOMY, INTESTINE  Patient location during evaluation: PACU Anesthesia Type: General Level of consciousness: awake and alert Pain management: pain level controlled Vital Signs Assessment: post-procedure vital signs reviewed and stable Respiratory status: spontaneous breathing, nonlabored ventilation, respiratory function stable and patient connected to nasal cannula oxygen Cardiovascular status: blood pressure returned to baseline and stable Postop Assessment: no apparent nausea or vomiting Anesthetic complications: no   No notable events documented.   Last Vitals:  Vitals:   08/22/24 0918 08/22/24 0928  BP: 118/65 125/69  Pulse: (!) 57 (!) 54  Resp: 12 11  Temp:    SpO2: 100% 100%    Last Pain:  Vitals:   08/22/24 0928  TempSrc:   PainSc: 0-No pain                 [place] KANDICE Clause      "

## 2024-08-22 NOTE — Op Note (Addendum)
 [facility] Gastroenterology Patient Name: Jason Hayes Procedure Date: 08/22/2024 8:33 AM MRN: [id number] Account #: [id number] Date of Birth: Dec 14, 1950 Admit Type: Outpatient Age: 74 Room: [facility] ENDO ROOM 1 Gender: Male Note Status: Finalized Instrument Name: Colon Scope [id number] Procedure:             Colonoscopy Indications:           Surveillance: Personal history of adenomatous polyps                         on last colonoscopy 5 years ago, Last colonoscopy:                         January 2020 Providers:             Ruel Kung MD, MD Referring MD:          Chelsea Aurora (Referring MD) Medicines:             Monitored Anesthesia Care Complications:         No immediate complications. Procedure:             Pre-Anesthesia Assessment:                        - Prior to the procedure, a History and Physical was                         performed, and patient medications, allergies and                         sensitivities were reviewed. The patient's tolerance                         of previous anesthesia was reviewed.                        - The risks and benefits of the procedure and the                         sedation options and risks were discussed with the                         patient. All questions were answered and informed                         consent was obtained.                        - ASA Grade Assessment: II - A patient with mild                         systemic disease.                        After obtaining informed consent, the colonoscope was                         passed under direct vision. Throughout the procedure,                         the patient's blood pressure,  pulse, and oxygen                         saturations were monitored continuously. The                         Colonoscope was introduced through the anus and                         advanced to the the cecum, identified by the                          appendiceal orifice. The colonoscopy was performed                         with ease. The patient tolerated the procedure well.                         The quality of the bowel preparation was excellent.                         Anatomical landmarks were photographed. Findings:      The perianal and digital rectal examinations were normal.      Two sessile polyps were found in the rectum and transverse colon. The       polyps were 5 to 6 mm in size. These polyps were removed with a cold       snare. Resection and retrieval were complete.      The exam was otherwise without abnormality. Impression:            - Two 5 to 6 mm polyps in the rectum and in the                         transverse colon, removed with a cold snare. Resected                         and retrieved.                        - The examination was otherwise normal. Recommendation:        - Discharge patient to home (with escort).                        - Resume previous diet.                        - Continue present medications.                        - Await pathology results.                        - Repeat colonoscopy is not recommended due to current                         age (85 years or older) for surveillance. Procedure Code(s):     --- Professional ---                        [phone], Colonoscopy, flexible; with  removal of                         tumor(s), polyp(s), or other lesion(s) by snare                         technique Diagnosis Code(s):     --- Professional ---                        Z86.010, Personal history of colonic polyps                        D12.8, Benign neoplasm of rectum                        D12.3, Benign neoplasm of transverse colon (hepatic                         flexure or splenic flexure) CPT copyright 2022 American Medical Association. All rights reserved. The codes documented in this report are preliminary and upon coder review may  be revised to meet current compliance  requirements. Ruel Kung, MD Ruel Kung MD, MD 08/22/2024 8:57:20 AM This report has been signed electronically. Number of Addenda: 0 Note Initiated On: 08/22/2024 8:33 AM Scope Withdrawal Time: 0 hours 11 minutes 44 seconds  Total Procedure Duration: 0 hours 15 minutes 4 seconds  Estimated Blood Loss:  Estimated blood loss: none.      [facility]

## 2024-08-22 NOTE — Anesthesia Preprocedure Evaluation (Signed)
"                                    Anesthesia Evaluation  Patient identified by MRN, date of birth, ID band Patient awake    Reviewed: Allergy & Precautions, H&P , NPO status , Patient's Chart, lab work & pertinent test results, reviewed documented beta blocker date and time   Airway Mallampati: II   Neck ROM: full    Dental  (+) Poor Dentition   Pulmonary neg shortness of breath, asthma    Pulmonary exam normal        Cardiovascular Exercise Tolerance: Good negative cardio ROS Normal cardiovascular exam Rhythm:regular Rate:Normal     Neuro/Psych negative neurological ROS  negative psych ROS   GI/Hepatic negative GI ROS, Neg liver ROS,,,  Endo/Other  negative endocrine ROS    Renal/GU Renal disease  negative genitourinary   Musculoskeletal   Abdominal   Peds  Hematology negative hematology ROS (+)   Anesthesia Other Findings Past Medical History: No date: Asthma     Comment:  childhood No date: Colon polyps No date: Hyperlipidemia No date: Kidney stones Past Surgical History: 10/16/2020: CATARACT EXTRACTION W/PHACO; Right     Comment:  Procedure: CATARACT EXTRACTION PHACO AND INTRAOCULAR               LENS PLACEMENT (IOC) RIGHT 5.67 00:36.1;  Surgeon:               Jaye Fallow, MD;  Location: [facility];                Service: Ophthalmology;  Laterality: Right; 10/30/2020: CATARACT EXTRACTION W/PHACO; Left     Comment:  Procedure: CATARACT EXTRACTION PHACO AND INTRAOCULAR               LENS PLACEMENT (IOC) LEFT;  Surgeon: Jaye Fallow,               MD;  Location: [facility];  Service:               Ophthalmology;  Laterality: Left;  5.89 0:31.4 08/23/2018: COLONOSCOPY WITH PROPOFOL ; N/A     Comment:  Procedure: COLONOSCOPY WITH PROPOFOL ;  Surgeon: Therisa Bi, MD;  Location: [facility];  Service:               Gastroenterology;  Laterality: N/A; No date: EYE SURGERY No date: FOOT SURGERY No  date: HERNIA REPAIR No date: KNEE SURGERY   Reproductive/Obstetrics negative OB ROS                              Anesthesia Physical Anesthesia Plan  ASA: 2  Anesthesia Plan: General   Post-op Pain Management:    Induction:   PONV Risk Score and Plan:   Airway Management Planned:   Additional Equipment:   Intra-op Plan:   Post-operative Plan:   Informed Consent: I have reviewed the patients History and Physical, chart, labs and discussed the procedure including the risks, benefits and alternatives for the proposed anesthesia with the patient or authorized representative who has indicated his/her understanding and acceptance.     Dental Advisory Given  Plan Discussed with: CRNA  Anesthesia Plan Comments:         Anesthesia Quick Evaluation  "

## 2024-08-22 NOTE — H&P (Signed)
 "  Ruel Kung , MD [address], [place], [place], [address] Phone: [phone] Fax: [phone]  Primary Care Physician:  Vincente Saber, NP   Pre-Procedure History & Physical: HPI:  Jason Hayes is a 74 y.o. male is here for an colonoscopy.   Past Medical History:  Diagnosis Date   Asthma    childhood   Colon polyps    Hyperlipidemia    Kidney stones     Past Surgical History:  Procedure Laterality Date   CATARACT EXTRACTION W/PHACO Right 10/16/2020   Procedure: CATARACT EXTRACTION PHACO AND INTRAOCULAR LENS PLACEMENT (IOC) RIGHT 5.67 00:36.1;  Surgeon: Jaye Fallow, MD;  Location: [facility];  Service: Ophthalmology;  Laterality: Right;   CATARACT EXTRACTION W/PHACO Left 10/30/2020   Procedure: CATARACT EXTRACTION PHACO AND INTRAOCULAR LENS PLACEMENT (IOC) LEFT;  Surgeon: Jaye Fallow, MD;  Location: [facility];  Service: Ophthalmology;  Laterality: Left;  5.89 0:31.4   COLONOSCOPY WITH PROPOFOL  N/A 08/23/2018   Procedure: COLONOSCOPY WITH PROPOFOL ;  Surgeon: Kung Ruel, MD;  Location: [facility] ENDOSCOPY;  Service: Gastroenterology;  Laterality: N/A;   EYE SURGERY     FOOT SURGERY     HERNIA REPAIR     KNEE SURGERY      Prior to Admission medications  Medication Sig Start Date End Date Taking? Authorizing Provider  atorvastatin  (LIPITOR) 20 MG tablet Take 1 tablet (20 mg total) by mouth at bedtime. 04/15/24   Kaur, Charanpreet, NP  ibuprofen (ADVIL) 200 MG tablet Take 200 mg by mouth every 6 (six) hours as needed.    [provider]  meloxicam  (MOBIC ) 7.5 MG tablet Take 1 tablet by mouth once daily Patient not taking: Reported on 02/29/2024 03/25/23   Vincente Saber, NP    Allergies as of 07/25/2024   (No Known Allergies)    Family History  Problem Relation Age of Onset   Hyperlipidemia Mother    Hypertension Mother    Diabetes Mother    Heart disease Mother    Hypertension Father    Hyperlipidemia Father     Diabetes Father    Heart disease Father    Hyperlipidemia Sister    Hypertension Sister    Diabetes Sister    Cancer Sister    Ovarian cancer Sister    Hypertension Brother    Hyperlipidemia Brother    Diabetes Brother    Prostate cancer Brother    Prostate cancer Brother    Stroke Sister     Social History   Socioeconomic History   Marital status: Married    Spouse name: Not on file   Number of children: Not on file   Years of education: Not on file   Highest education level: Some college, no degree  Occupational History   Not on file  Tobacco Use   Smoking status: Never   Smokeless tobacco: Never  Vaping Use   Vaping status: Never Used  Substance and Sexual Activity   Alcohol use: No    Alcohol/week: 0.0 standard drinks of alcohol   Drug use: No   Sexual activity: Yes    Partners: Female  Other Topics Concern   Not on file  Social History Narrative   married   Social Drivers of Health   Tobacco Use: Low Risk (04/15/2024)   Patient History    Smoking Tobacco Use: Never    Smokeless Tobacco Use: Never    Passive Exposure: Not on file  Financial Resource Strain: Low Risk (02/25/2024)  Overall Financial Resource Strain (CARDIA)    Difficulty of Paying Living Expenses: Not hard at all  Food Insecurity: No Food Insecurity (02/25/2024)   Epic    Worried About Programme Researcher, Broadcasting/film/video in the Last Year: Never true    Ran Out of Food in the Last Year: Never true  Transportation Needs: No Transportation Needs (02/25/2024)   Epic    Lack of Transportation (Medical): No    Lack of Transportation (Non-Medical): No  Physical Activity: Sufficiently Active (02/25/2024)   Exercise Vital Sign    Days of Exercise per Week: 4 days    Minutes of Exercise per Session: 90 min  Stress: No Stress Concern Present (02/25/2024)   [employer] of Occupational Health - Occupational Stress Questionnaire    Feeling of Stress: Not at all  Social Connections: Moderately Integrated  (02/25/2024)   Social Connection and Isolation Panel    Frequency of Communication with Friends and Family: Once a week    Frequency of Social Gatherings with Friends and Family: Once a week    Attends Religious Services: More than 4 times per year    Active Member of Clubs or Organizations: Yes    Attends Banker Meetings: More than 4 times per year    Marital Status: Married  Catering Manager Violence: Not At Risk (02/29/2024)   Epic    Fear of Current or Ex-Partner: No    Emotionally Abused: No    Physically Abused: No    Sexually Abused: No  Depression (PHQ2-9): Low Risk (04/15/2024)   Depression (PHQ2-9)    PHQ-2 Score: 1  Alcohol Screen: Low Risk (02/25/2024)   Alcohol Screen    Last Alcohol Screening Score (AUDIT): 0  Housing: Low Risk (02/25/2024)   Epic    Unable to Pay for Housing in the Last Year: No    Number of Times Moved in the Last Year: 0    Homeless in the Last Year: No  Utilities: Not At Risk (02/29/2024)   Epic    Threatened with loss of utilities: No  Health Literacy: Adequate Health Literacy (02/29/2024)   B1300 Health Literacy    Frequency of need for help with medical instructions: Never    Review of Systems: See HPI, otherwise negative ROS  Physical Exam: There were no vitals taken for this visit. General:   Alert,  pleasant and cooperative in NAD Head:  Normocephalic and atraumatic. Neck:  Supple; no masses or thyromegaly. Lungs:  Clear throughout to auscultation, normal respiratory effort.    Heart:  +S1, +S2, Regular rate and rhythm, No edema. Abdomen:  Soft, nontender and nondistended. Normal bowel sounds, without guarding, and without rebound.   Neurologic:  Alert and  oriented x4;  grossly normal neurologically.  Impression/Plan: [name] is here for an colonoscopy to be performed for surveillance due to prior history of colon polyps   Risks, benefits, limitations, and alternatives regarding  colonoscopy have been reviewed  with the patient.  Questions have been answered.  All parties agreeable.   Ruel Kung, MD  08/22/2024, 8:09 AM  "

## 2024-08-24 LAB — SURGICAL PATHOLOGY

## 2024-08-25 ENCOUNTER — Ambulatory Visit: Payer: Self-pay | Admitting: Gastroenterology

## 2025-03-03 ENCOUNTER — Ambulatory Visit
